# Patient Record
Sex: Female | Born: 1965
Health system: Southern US, Community
[De-identification: ages and names within clinical notes are randomized; demographics above are authoritative.]

## PROBLEM LIST (undated history)

## (undated) DIAGNOSIS — G2581 Restless legs syndrome: Secondary | ICD-10-CM

## (undated) DIAGNOSIS — F329 Major depressive disorder, single episode, unspecified: Secondary | ICD-10-CM

## (undated) DIAGNOSIS — G4730 Sleep apnea, unspecified: Secondary | ICD-10-CM

## (undated) DIAGNOSIS — F32A Depression, unspecified: Secondary | ICD-10-CM

## (undated) DIAGNOSIS — K219 Gastro-esophageal reflux disease without esophagitis: Secondary | ICD-10-CM

## (undated) DIAGNOSIS — K449 Diaphragmatic hernia without obstruction or gangrene: Secondary | ICD-10-CM

## (undated) DIAGNOSIS — E669 Obesity, unspecified: Secondary | ICD-10-CM

## (undated) DIAGNOSIS — I10 Essential (primary) hypertension: Secondary | ICD-10-CM

## (undated) DIAGNOSIS — M797 Fibromyalgia: Secondary | ICD-10-CM

## (undated) DIAGNOSIS — E559 Vitamin D deficiency, unspecified: Secondary | ICD-10-CM

## (undated) HISTORY — DX: Depression, unspecified: F32.A

## (undated) HISTORY — DX: Gastro-esophageal reflux disease without esophagitis: K21.9

## (undated) HISTORY — DX: Restless legs syndrome: G25.81

## (undated) HISTORY — DX: Essential (primary) hypertension: I10

## (undated) HISTORY — DX: Diaphragmatic hernia without obstruction or gangrene: K44.9

## (undated) HISTORY — DX: Fibromyalgia: M79.7

## (undated) HISTORY — DX: Major depressive disorder, single episode, unspecified: F32.9

## (undated) HISTORY — DX: Vitamin D deficiency, unspecified: E55.9

## (undated) HISTORY — PX: TUBAL LIGATION: SHX77

## (undated) HISTORY — DX: Obesity, unspecified: E66.9

## (undated) HISTORY — PX: CATARACT EXTRACTION: SUR2

## (undated) HISTORY — DX: Sleep apnea, unspecified: G47.30

---

## 2001-01-07 ENCOUNTER — Other Ambulatory Visit: Admission: RE | Admit: 2001-01-07 | Discharge: 2001-01-07 | Payer: Self-pay | Admitting: Obstetrics and Gynecology

## 2001-09-17 ENCOUNTER — Inpatient Hospital Stay (HOSPITAL_COMMUNITY): Admission: AD | Admit: 2001-09-17 | Discharge: 2001-09-20 | Payer: Self-pay | Admitting: Obstetrics and Gynecology

## 2003-01-10 ENCOUNTER — Encounter: Admission: RE | Admit: 2003-01-10 | Discharge: 2003-04-10 | Payer: Self-pay | Admitting: Pediatrics

## 2003-11-06 ENCOUNTER — Ambulatory Visit (HOSPITAL_COMMUNITY): Admission: RE | Admit: 2003-11-06 | Discharge: 2003-11-06 | Payer: Self-pay | Admitting: Family Medicine

## 2004-05-14 ENCOUNTER — Ambulatory Visit (HOSPITAL_BASED_OUTPATIENT_CLINIC_OR_DEPARTMENT_OTHER): Admission: RE | Admit: 2004-05-14 | Discharge: 2004-05-14 | Payer: Self-pay | Admitting: Family Medicine

## 2006-06-29 ENCOUNTER — Emergency Department (HOSPITAL_COMMUNITY): Admission: EM | Admit: 2006-06-29 | Discharge: 2006-06-29 | Payer: Self-pay | Admitting: Emergency Medicine

## 2010-12-02 ENCOUNTER — Other Ambulatory Visit (HOSPITAL_COMMUNITY): Payer: Self-pay | Admitting: Pediatrics

## 2010-12-02 DIAGNOSIS — Z139 Encounter for screening, unspecified: Secondary | ICD-10-CM

## 2010-12-09 ENCOUNTER — Ambulatory Visit (HOSPITAL_COMMUNITY)
Admission: RE | Admit: 2010-12-09 | Discharge: 2010-12-09 | Disposition: A | Discharge status: Home / Self Care | Payer: BC Managed Care – PPO | Source: Ambulatory Visit | Attending: Pediatrics | Admitting: Pediatrics

## 2010-12-09 DIAGNOSIS — Z139 Encounter for screening, unspecified: Secondary | ICD-10-CM

## 2010-12-09 DIAGNOSIS — Z1231 Encounter for screening mammogram for malignant neoplasm of breast: Secondary | ICD-10-CM | POA: Insufficient documentation

## 2010-12-10 ENCOUNTER — Ambulatory Visit (HOSPITAL_COMMUNITY): Payer: Self-pay

## 2011-05-08 ENCOUNTER — Ambulatory Visit: Payer: BC Managed Care – PPO | Admitting: Orthopedic Surgery

## 2011-05-08 ENCOUNTER — Ambulatory Visit (INDEPENDENT_AMBULATORY_CARE_PROVIDER_SITE_OTHER): Payer: BC Managed Care – PPO | Admitting: Orthopedic Surgery

## 2011-05-08 ENCOUNTER — Encounter: Payer: Self-pay | Admitting: Orthopedic Surgery

## 2011-05-08 VITALS — BP 132/82 | Ht <= 58 in | Wt 226.0 lb

## 2011-05-08 DIAGNOSIS — M7660 Achilles tendinitis, unspecified leg: Secondary | ICD-10-CM

## 2011-05-08 NOTE — Patient Instructions (Addendum)
Continue gel   Wear brace x 6 weeks   Apply ice 20 min 3 x a day

## 2011-05-12 ENCOUNTER — Encounter: Payer: Self-pay | Admitting: Orthopedic Surgery

## 2011-05-12 NOTE — Progress Notes (Signed)
Chief complaint: PAIN RIGHT ANKLE  HPI:(73) 45 year old female with moderate to severe aching, throbbing, atraumatic onset of pain RIGHT ankle.  ROS:(2) Denies chest pain, numbness or tingling.  PFSH: (1)  Past Medical History  Diagnosis Date  . Fibromyalgia   . Diabetes mellitus   . HTN (hypertension)   . RLS (restless legs syndrome)   . Depression     Physical Exam(12) GENERAL: normal development   CDV: pulses are normal   Skin: normal  Lymph: nodes were not palpable/normal  Psychiatric: awake, alert and oriented  Neuro: normal sensation  MSK AMBULATION: NO LIMP 1 TENDERNESS OVER THE ACHILLES TENDON WITH NODULE AT WATERSHED AREA  2 ROM IN THE ANKLE IS NORMAL  3 STRENGTH IN THE ANKLE IS NORMAL  4 THE ANKLE IS STABLE  5 PTT IS NON TENDER  6 LEFT ANKLE ROM AND STABILITY ARE NORMAL   Imaging: PLAIN FILMS: SEP XR REPORT; 3 VIEWS RIGHT FOOT: NORMAL FOOT STRUCTURE AND ARCHITECTURE IMPR: NORMAL FOOT   Assessment: ACHILLES TENDONOSIS AND TENDONITIS     Plan: CAM WALKER, NSAIDS, REST X 6 WEEKS

## 2011-06-17 ENCOUNTER — Telehealth: Payer: Self-pay | Admitting: Orthopedic Surgery

## 2011-06-17 NOTE — Telephone Encounter (Signed)
Patient called to relay that the top piece of the velcro and also a piece of plastic has broken off of the brace (cam walker) received at 05/08/11 office visit.  Her phone # is [phone].  * I contacted [employer] brace representative, Murrell Converse via email to advise.

## 2011-06-23 NOTE — Telephone Encounter (Signed)
06/23/11 Sent a follow up email to brace representative and advised patient of this information.

## 2011-06-24 ENCOUNTER — Encounter: Payer: Self-pay | Admitting: Orthopedic Surgery

## 2011-06-24 ENCOUNTER — Ambulatory Visit (INDEPENDENT_AMBULATORY_CARE_PROVIDER_SITE_OTHER): Payer: BC Managed Care – PPO | Admitting: Orthopedic Surgery

## 2011-06-24 VITALS — BP 122/82 | Ht <= 58 in | Wt 220.0 lb

## 2011-06-24 DIAGNOSIS — M67979 Unspecified disorder of synovium and tendon, unspecified ankle and foot: Secondary | ICD-10-CM

## 2011-06-24 DIAGNOSIS — S93499A Sprain of other ligament of unspecified ankle, initial encounter: Secondary | ICD-10-CM

## 2011-06-24 DIAGNOSIS — S96819A Strain of other specified muscles and tendons at ankle and foot level, unspecified foot, initial encounter: Secondary | ICD-10-CM

## 2011-06-24 MED ORDER — NABUMETONE 500 MG PO TABS: 500.0000 mg | ORAL_TABLET | Freq: Two times a day (BID) | ORAL | Status: AC

## 2011-06-24 NOTE — Progress Notes (Signed)
Routine follow up  Previous visit:   Assessment: ACHILLES TENDONOSIS AND TENDONITIS   Plan: CAM WALKER, NSAIDS, REST X 6 WEEKS   Subjective: There has been mild improvement overall in the condition of the RIGHT foot, although the patient has noted that the Cam Dan Humphreys has come apart. She still has complaints of swelling intermittently, and she had difficulty shopping, the other day. Exam Shows that dorsiflexion is +5, plantar flexion, about 20 tenderness is over the Achilles tendon. No swelling today. There is some pain in the lateral part of the foot just beneath the malleolus.  Recommend open back shoe. Physical therapy, change in symptoms, nabumetone come back in a month if no improvement. MRI

## 2011-06-24 NOTE — Telephone Encounter (Signed)
Patient received call from [facility], representative, 06/23/11, and states replacement brace here in office for her appointment 06/24/11.  * Patient received the brace 06/24/11.  Per Joni Reining, no charge for replacement.

## 2011-06-24 NOTE — Patient Instructions (Signed)
START PT - WEAR OPEN BACK SHOE - START NEW NSAID

## 2011-07-31 ENCOUNTER — Ambulatory Visit: Payer: BC Managed Care – PPO | Admitting: Orthopedic Surgery

## 2011-07-31 ENCOUNTER — Telehealth: Payer: Self-pay | Admitting: Orthopedic Surgery

## 2011-07-31 NOTE — Telephone Encounter (Signed)
Patient had left a voice message at 9:03pm 07/30/11 about her appointment for today, 07/31/11, to notify us that she needs to cancel, and hold on re-scheduling at this time.  States she's been unable to continue the physical therapy and will follow up on the therapy and the appointment as soon as she can.  Said she is "between a rock and a hard place right now."  Left follow up message for patient.

## 2011-08-06 NOTE — Telephone Encounter (Signed)
As of today, 08/06/11, no further reply back from patient to follow up message.

## 2012-05-17 ENCOUNTER — Other Ambulatory Visit (HOSPITAL_COMMUNITY): Payer: Self-pay | Admitting: Physician Assistant

## 2012-05-17 ENCOUNTER — Other Ambulatory Visit (HOSPITAL_BASED_OUTPATIENT_CLINIC_OR_DEPARTMENT_OTHER): Payer: Self-pay | Admitting: Physician Assistant

## 2012-05-17 DIAGNOSIS — Z0000 Encounter for general adult medical examination without abnormal findings: Secondary | ICD-10-CM

## 2012-05-24 ENCOUNTER — Ambulatory Visit (HOSPITAL_COMMUNITY)
Admission: RE | Admit: 2012-05-24 | Discharge: 2012-05-24 | Disposition: A | Discharge status: Home / Self Care | Payer: BC Managed Care – PPO | Source: Ambulatory Visit | Attending: Physician Assistant | Admitting: Physician Assistant

## 2012-05-24 DIAGNOSIS — Z1231 Encounter for screening mammogram for malignant neoplasm of breast: Secondary | ICD-10-CM | POA: Insufficient documentation

## 2012-05-24 DIAGNOSIS — Z0000 Encounter for general adult medical examination without abnormal findings: Secondary | ICD-10-CM

## 2014-01-11 ENCOUNTER — Other Ambulatory Visit (HOSPITAL_COMMUNITY): Payer: Self-pay | Admitting: Physician Assistant

## 2014-01-11 DIAGNOSIS — Z139 Encounter for screening, unspecified: Secondary | ICD-10-CM

## 2014-01-13 ENCOUNTER — Ambulatory Visit (HOSPITAL_COMMUNITY)
Admission: RE | Admit: 2014-01-13 | Discharge: 2014-01-13 | Disposition: A | Discharge status: Home / Self Care | Payer: BC Managed Care – PPO | Source: Ambulatory Visit | Attending: Physician Assistant | Admitting: Physician Assistant

## 2014-01-13 DIAGNOSIS — Z139 Encounter for screening, unspecified: Secondary | ICD-10-CM

## 2014-01-13 DIAGNOSIS — Z1231 Encounter for screening mammogram for malignant neoplasm of breast: Secondary | ICD-10-CM | POA: Insufficient documentation

## 2014-04-06 ENCOUNTER — Other Ambulatory Visit (HOSPITAL_COMMUNITY): Payer: Self-pay | Admitting: Family Medicine

## 2014-04-06 DIAGNOSIS — N949 Unspecified condition associated with female genital organs and menstrual cycle: Secondary | ICD-10-CM

## 2014-04-06 DIAGNOSIS — N852 Hypertrophy of uterus: Secondary | ICD-10-CM

## 2014-04-10 ENCOUNTER — Other Ambulatory Visit (HOSPITAL_COMMUNITY): Payer: Self-pay | Admitting: Family Medicine

## 2014-04-10 DIAGNOSIS — N949 Unspecified condition associated with female genital organs and menstrual cycle: Secondary | ICD-10-CM

## 2014-04-10 DIAGNOSIS — N852 Hypertrophy of uterus: Secondary | ICD-10-CM

## 2014-04-11 ENCOUNTER — Ambulatory Visit (HOSPITAL_COMMUNITY)
Admission: RE | Admit: 2014-04-11 | Discharge: 2014-04-11 | Disposition: A | Discharge status: Home / Self Care | Payer: BC Managed Care – PPO | Source: Ambulatory Visit | Attending: Family Medicine | Admitting: Family Medicine

## 2014-04-11 DIAGNOSIS — N949 Unspecified condition associated with female genital organs and menstrual cycle: Secondary | ICD-10-CM

## 2014-04-11 DIAGNOSIS — N852 Hypertrophy of uterus: Secondary | ICD-10-CM

## 2015-11-15 DIAGNOSIS — M25511 Pain in right shoulder: Secondary | ICD-10-CM | POA: Diagnosis not present

## 2015-11-15 DIAGNOSIS — M25512 Pain in left shoulder: Secondary | ICD-10-CM | POA: Diagnosis not present

## 2015-11-15 DIAGNOSIS — R296 Repeated falls: Secondary | ICD-10-CM | POA: Diagnosis not present

## 2015-11-15 DIAGNOSIS — M797 Fibromyalgia: Secondary | ICD-10-CM | POA: Diagnosis not present

## 2015-12-26 DIAGNOSIS — M797 Fibromyalgia: Secondary | ICD-10-CM | POA: Diagnosis not present

## 2015-12-26 DIAGNOSIS — I10 Essential (primary) hypertension: Secondary | ICD-10-CM | POA: Diagnosis not present

## 2015-12-26 DIAGNOSIS — M2550 Pain in unspecified joint: Secondary | ICD-10-CM | POA: Diagnosis not present

## 2015-12-26 DIAGNOSIS — R5382 Chronic fatigue, unspecified: Secondary | ICD-10-CM | POA: Diagnosis not present

## 2015-12-26 DIAGNOSIS — Z719 Counseling, unspecified: Secondary | ICD-10-CM | POA: Diagnosis not present

## 2015-12-26 DIAGNOSIS — E119 Type 2 diabetes mellitus without complications: Secondary | ICD-10-CM | POA: Diagnosis not present

## 2016-04-21 ENCOUNTER — Other Ambulatory Visit (HOSPITAL_COMMUNITY): Payer: Self-pay | Admitting: Family Medicine

## 2016-04-21 DIAGNOSIS — Z1231 Encounter for screening mammogram for malignant neoplasm of breast: Secondary | ICD-10-CM

## 2016-04-28 ENCOUNTER — Ambulatory Visit (HOSPITAL_COMMUNITY)
Admission: RE | Admit: 2016-04-28 | Discharge: 2016-04-28 | Disposition: A | Discharge status: Home / Self Care | Payer: BLUE CROSS/BLUE SHIELD | Source: Ambulatory Visit | Attending: Family Medicine | Admitting: Family Medicine

## 2016-04-28 DIAGNOSIS — Z1231 Encounter for screening mammogram for malignant neoplasm of breast: Secondary | ICD-10-CM | POA: Diagnosis not present

## 2017-05-19 LAB — LIPID PANEL
CHOLESTEROL: 164 (ref 0–200)
HDL: 66 (ref 35–70)
LDL Cholesterol: 76
Triglycerides: 130 (ref 40–160)

## 2017-05-19 LAB — VITAMIN D 25 HYDROXY (VIT D DEFICIENCY, FRACTURES): Vit D, 25-Hydroxy: 19.3

## 2017-05-19 LAB — HEMOGLOBIN A1C: HEMOGLOBIN A1C: 13.8

## 2017-06-29 ENCOUNTER — Ambulatory Visit: Payer: BLUE CROSS/BLUE SHIELD | Admitting: "Endocrinology

## 2017-06-29 ENCOUNTER — Encounter: Payer: Self-pay | Admitting: "Endocrinology

## 2017-06-29 VITALS — BP 157/99 | HR 90 | Ht <= 58 in | Wt 234.0 lb

## 2017-06-29 DIAGNOSIS — E1165 Type 2 diabetes mellitus with hyperglycemia: Secondary | ICD-10-CM | POA: Diagnosis not present

## 2017-06-29 DIAGNOSIS — I10 Essential (primary) hypertension: Secondary | ICD-10-CM | POA: Diagnosis not present

## 2017-06-29 MED ORDER — VITAMIN D3 125 MCG (5000 UT) PO CAPS: 5000.0000 [IU] | ORAL_CAPSULE | Freq: Every day | ORAL | 0 refills | Status: DC

## 2017-06-29 NOTE — Patient Instructions (Signed)

## 2017-06-29 NOTE — Progress Notes (Signed)
Consult Note       06/29/2017, 4:41 PM   Subjective:    Patient ID: Nicole [name], female    DOB: Apr 20, 1966.  Nicole [name] is being seen in consultation for management of currently uncontrolled symptomatic diabetes requested by  Jonathon BellowsMcGee, Rachel, DO.   Past Medical History:  Diagnosis Date  . Depression   . Diabetes mellitus   . Fibromyalgia   . HTN (hypertension)   . RLS (restless legs syndrome)    Past Surgical History:  Procedure Laterality Date  . CESAREAN SECTION    . TUBAL LIGATION     Social History   Socioeconomic History  . Marital status: Married    Spouse name: None  . Number of children: None  . Years of education: MBA  . Highest education level: None  Social Needs  . Financial resource strain: None  . Food insecurity - worry: None  . Food insecurity - inability: None  . Transportation needs - medical: None  . Transportation needs - non-medical: None  Occupational History  . Occupation: stay at home mom  . Occupation: substitute  Tobacco Use  . Smoking status: Never Smoker  . Smokeless tobacco: Never Used  Substance and Sexual Activity  . Alcohol use: No  . Drug use: No  . Sexual activity: None  Other Topics Concern  . None  Social History Narrative  . None   Outpatient Encounter Medications as of 06/29/2017  Medication Sig  . aspirin EC 81 MG tablet Take 81 mg by mouth daily.  . Insulin Glargine (TOUJEO SOLOSTAR) 300 UNIT/ML SOPN Inject 40 Units into the skin at bedtime.  . liraglutide (VICTOZA) 18 MG/3ML SOPN Inject 1.2 mg into the skin daily.  . pregabalin (LYRICA) 75 MG capsule Take 75 mg by mouth 2 (two) times daily.  . [DISCONTINUED] pioglitazone (ACTOS) 45 MG tablet Take 45 mg by mouth daily.  . Cholecalciferol (VITAMIN D3) 5000 units CAPS Take 1 capsule (5,000 Units total) by mouth daily.  . [DISCONTINUED] diclofenac sodium (VOLTAREN) 1 % GEL Apply  topically.    . [DISCONTINUED] gabapentin (NEURONTIN) 100 MG capsule Take 300 mg by mouth 3 (three) times daily.    . [DISCONTINUED] insulin glargine (LANTUS) 100 UNIT/ML injection Per sliding scale   . [DISCONTINUED] levofloxacin (LEVAQUIN) 500 MG tablet TAKE 1 TABLET BY MOUTH EVERY EVERY 24 HOURS FOR 10 DAYS  . [DISCONTINUED] lisinopril (PRINIVIL,ZESTRIL) 5 MG tablet Take 5 mg by mouth daily.    . [DISCONTINUED] metFORMIN (GLUCOPHAGE) 500 MG tablet Take 500 mg by mouth 2 (two) times daily with a meal.    . [DISCONTINUED] pioglitazone (ACTOS) 45 MG tablet daily.  . [DISCONTINUED] VICTOZA 18 MG/3ML SOPN 0.6 mg daily.   No facility-administered encounter medications on file as of 06/29/2017.     ALLERGIES: Allergies  Allergen Reactions  . Metformin And Related     VACCINATION STATUS:  There is no immunization history on file for this patient.  Diabetes  She presents for her initial diabetic visit. She has type 2 diabetes mellitus. Onset time: She was diagnosed at approximate age of 32 years. She had history  of gestational diabetes with her second pregnancy 1 year prior to her diagnosed with type 2 diabetes. Her disease course has been worsening. There are no hypoglycemic associated symptoms. Pertinent negatives for hypoglycemia include no confusion, headaches, pallor or seizures. Associated symptoms include blurred vision, fatigue, polydipsia and polyuria. Pertinent negatives for diabetes include no chest pain and no polyphagia. There are no hypoglycemic complications. Symptoms are worsening. There are no diabetic complications. Risk factors for coronary artery disease include diabetes mellitus, dyslipidemia, family history, hypertension, obesity and sedentary lifestyle. Current diabetic treatment includes insulin injections and oral agent (dual therapy) (Currently on basal insulin 27 units every morning, Actos to 45 minutes by mouth daily, Victoza 0.6 mg daily.). Her weight is increasing  steadily. She is following a generally unhealthy diet. When asked about meal planning, she reported none. She has had a previous visit with a dietitian. She never participates in exercise. (She did not bring any meter nor logs to review today. Her most recent A1c was 13.8% from 05/19/2017.) An ACE inhibitor/angiotensin II receptor blocker is being taken. Eye exam is current.  Hypertension  This is a chronic problem. The current episode started more than 1 year ago. The problem is uncontrolled. Associated symptoms include blurred vision. Pertinent negatives include no chest pain, headaches, palpitations or shortness of breath. Risk factors for coronary artery disease include diabetes mellitus, dyslipidemia, obesity and sedentary lifestyle. Past treatments include ACE inhibitors.      Review of Systems  Constitutional: Positive for fatigue. Negative for chills, fever and unexpected weight change.  HENT: Negative for trouble swallowing and voice change.   Eyes: Positive for blurred vision. Negative for visual disturbance.  Respiratory: Negative for cough, shortness of breath and wheezing.   Cardiovascular: Negative for chest pain, palpitations and leg swelling.  Gastrointestinal: Negative for diarrhea, nausea and vomiting.  Endocrine: Positive for polydipsia and polyuria. Negative for cold intolerance, heat intolerance and polyphagia.  Musculoskeletal: Negative for arthralgias and myalgias.  Skin: Negative for color change, pallor, rash and wound.  Neurological: Negative for seizures and headaches.  Psychiatric/Behavioral: Negative for confusion and suicidal ideas.    Objective:    BP (!) 157/99   Pulse 90   Ht 4\' 9"  (1.448 m)   Wt 234 lb (106.1 kg)   BMI 50.64 kg/m   Wt Readings from Last 3 Encounters:  06/29/17 234 lb (106.1 kg)  06/24/11 220 lb (99.8 kg)  05/08/11 226 lb (102.5 kg)     Physical Exam  Constitutional: She is oriented to person, place, and time. She appears  well-developed.  HENT:  Head: Normocephalic and atraumatic.  Eyes: EOM are normal.  Neck: Normal range of motion. Neck supple. No tracheal deviation present. No thyromegaly present.  Cardiovascular: Normal rate and regular rhythm.  Pulmonary/Chest: Effort normal and breath sounds normal.  Abdominal: Soft. Bowel sounds are normal. There is no tenderness. There is no guarding.  Musculoskeletal: Normal range of motion. She exhibits no edema.  Neurological: She is alert and oriented to person, place, and time. She has normal reflexes. No cranial nerve deficit. Coordination normal.  Skin: Skin is warm and dry. No rash noted. No erythema. No pallor.  Psychiatric: She has a normal mood and affect. Judgment normal.     Diabetic Labs (most recent): Lab Results  Component Value Date   HGBA1C 13.8 05/19/2017     Lipid Panel ( most recent) Lipid Panel     Component Value Date/Time   CHOL 164 05/19/2017   TRIG 130  05/19/2017   HDL 66 05/19/2017   LDLCALC 76 05/19/2017      Assessment & Plan:   1. Uncontrolled type 2 diabetes mellitus with hyperglycemia (HCC)  - Nicole [name] has currently uncontrolled symptomatic type 2 DM since  51 years of age,  with most recent A1c of 13.8 %. Recent labs reviewed.  -her diabetes is complicated by obesity/sedentary life and Nicole [name] remains at a high risk for more acute and chronic complications which include CAD, CVA, CKD, retinopathy, and neuropathy. These are all discussed in detail with the patient.  - I have counseled her on diet management and weight loss, by adopting a carbohydrate restricted/protein rich diet.  - Suggestion is made for her to avoid simple carbohydrates  from her diet including Cakes, Sweet Desserts, Ice Cream, Soda (diet and regular), Sweet Tea, Candies, Chips, Cookies, Store Bought Juices, Alcohol in Excess of  1-2 drinks a day, Artificial Sweeteners, and "Sugar-free" Products. This will help patient to have  stable blood glucose profile and potentially avoid unintended weight gain.  - I encouraged her to switch to  unprocessed or minimally processed complex starch and increased protein intake (animal or plant source), fruits, and vegetables.  - she is advised to stick to a routine mealtimes to eat 3 meals  a day and avoid unnecessary snacks ( to snack only to correct hypoglycemia).   - she will be scheduled with Norm Salt, RDN, CDE for individualized diabetes education.  - I have approached her with the following individualized plan to manage diabetes and patient agrees:   - Given her current glycemic pertinent, she may very swelling require basal/bolus insulin treatment to achieve control of diabetes to target. - However, it is essential to assure her commitment for proper monitoring of blood glucose for safe use of insulin. - I approached her to initiate strict monitoring of blood glucose 4 times a day-before meals and at bedtime and return in one week with her meter and logs for reevaluation. - In the meantime, I have adjusted her basal insulin Toujeo to 40 units at bedtime, increase Victoza to 1.2 mg daily-half hour before her biggest meal, discontinue Actos. - she reports intolerance to metformin. - Patient is warned not to take insulin without proper monitoring per orders. -Patient is encouraged to call clinic for blood glucose levels less than 70 or above 300 mg /dl.  - Patient specific target  A1c;  LDL, HDL, Triglycerides, and  Waist Circumference were discussed in detail.  2) BP/HTN: Uncontrolled. Continue current medications including ACEI/ARB. 3) Lipids/HPL:    Controlled LDL at 76.   Patient is not on statins. 4)  Weight/Diet: CDE Consult will be initiated , exercise, and detailed carbohydrates information provided. She'll be a good candidate for bariatric surgery, briefly discussed with her. More discussion on subsequent visits.  5) Chronic Care/Health Maintenance:  -she  is  on ACEI/ARB  is encouraged to continue to follow up with Ophthalmology, Dentist,  Podiatrist at least yearly or according to recommendations, and advised to   stay away from smoking. I have recommended yearly flu vaccine and pneumonia vaccination at least every 5 years; moderate intensity exercise for up to 150 minutes weekly; and  sleep for at least 7 hours a day.  - I advised patient to maintain close follow up with Jonathon Bellows, DO for primary care needs.  - Time spent with the patient: 1 hour, of which >50% was spent in obtaining information about her symptoms, reviewing her  previous labs, evaluations, and treatments, counseling her about her  uncontrolled type 2 diabetes, hypertension, and developing a plan for long term treatment; her  questions were answered to her satisfaction.  Follow up plan: - Return in about 1 week (around 07/06/2017) for follow up with meter and logs- no labs.  Marquis Lunch, MD [facility] [address] [place], [place] [address] Phone: [phone]  Fax: [phone]    06/29/2017, 4:41 PM  This note was partially dictated with voice recognition software. Similar sounding words can be transcribed inadequately or may not  be corrected upon review.

## 2017-07-02 ENCOUNTER — Other Ambulatory Visit: Payer: Self-pay

## 2017-07-02 ENCOUNTER — Telehealth: Payer: Self-pay | Admitting: "Endocrinology

## 2017-07-02 MED ORDER — LIRAGLUTIDE 18 MG/3ML ~~LOC~~ SOPN: 1.2000 mg | PEN_INJECTOR | Freq: Every day | SUBCUTANEOUS | 2 refills | Status: DC

## 2017-07-02 MED ORDER — INSULIN GLARGINE 300 UNIT/ML ~~LOC~~ SOPN
40.0000 [IU] | PEN_INJECTOR | Freq: Every day | SUBCUTANEOUS | 2 refills | Status: DC
Start: 2017-07-02 — End: 2017-07-09

## 2017-07-02 NOTE — Telephone Encounter (Signed)
Nicole LagerYolanda needs her new Rx of Toujeo and Victoza called to [facility], please advise?

## 2017-07-02 NOTE — Telephone Encounter (Signed)
Rx sent 

## 2017-07-09 ENCOUNTER — Encounter: Payer: Self-pay | Admitting: "Endocrinology

## 2017-07-09 ENCOUNTER — Ambulatory Visit: Payer: BLUE CROSS/BLUE SHIELD | Admitting: "Endocrinology

## 2017-07-09 VITALS — BP 144/88 | HR 91 | Ht <= 58 in | Wt 236.0 lb

## 2017-07-09 DIAGNOSIS — E1165 Type 2 diabetes mellitus with hyperglycemia: Secondary | ICD-10-CM

## 2017-07-09 DIAGNOSIS — I10 Essential (primary) hypertension: Secondary | ICD-10-CM | POA: Diagnosis not present

## 2017-07-09 MED ORDER — ACCU-CHEK GUIDE W/DEVICE KIT: 1.0000 | PACK | 0 refills | Status: AC

## 2017-07-09 MED ORDER — GLUCOSE BLOOD VI STRP
ORAL_STRIP | 2 refills | Status: DC
Start: 2017-07-09 — End: 2020-04-09

## 2017-07-09 MED ORDER — INSULIN GLARGINE 300 UNIT/ML ~~LOC~~ SOPN: 40.0000 [IU] | PEN_INJECTOR | Freq: Every day | SUBCUTANEOUS | 2 refills | Status: DC

## 2017-07-09 NOTE — Patient Instructions (Signed)

## 2017-07-09 NOTE — Progress Notes (Signed)
Consult Note       07/09/2017, 4:07 PM   Subjective:    Patient ID: Nicole [name], female    DOB: 08/31/1965.  Nicole [name] is being seen in consultation for management of currently uncontrolled symptomatic diabetes requested by  Sherrilee Gilles, DO.   Past Medical History:  Diagnosis Date  . Depression   . Diabetes mellitus   . Fibromyalgia   . HTN (hypertension)   . RLS (restless legs syndrome)    Past Surgical History:  Procedure Laterality Date  . CESAREAN SECTION    . TUBAL LIGATION     Social History   Socioeconomic History  . Marital status: Married    Spouse name: None  . Number of children: None  . Years of education: MBA  . Highest education level: None  Social Needs  . Financial resource strain: None  . Food insecurity - worry: None  . Food insecurity - inability: None  . Transportation needs - medical: None  . Transportation needs - non-medical: None  Occupational History  . Occupation: stay at home mom  . Occupation: substitute  Tobacco Use  . Smoking status: Never Smoker  . Smokeless tobacco: Never Used  Substance and Sexual Activity  . Alcohol use: No  . Drug use: No  . Sexual activity: None  Other Topics Concern  . None  Social History Narrative  . None   Outpatient Encounter Medications as of 07/09/2017  Medication Sig  . aspirin EC 81 MG tablet Take 81 mg by mouth daily.  . Blood Glucose Monitoring Suppl (ACCU-CHEK GUIDE) w/Device KIT 1 Piece by Does not apply route as directed.  . Cholecalciferol (VITAMIN D3) 5000 units CAPS Take 1 capsule (5,000 Units total) by mouth daily.  Marland Kitchen glucose blood (ACCU-CHEK GUIDE) test strip Use as instructed  . Insulin Glargine (TOUJEO SOLOSTAR) 300 UNIT/ML SOPN Inject 40 Units into the skin at bedtime.  . liraglutide (VICTOZA) 18 MG/3ML SOPN Inject 0.2 mLs (1.2 mg total) into the skin daily.  . pregabalin (LYRICA) 75 MG  capsule Take 75 mg by mouth 2 (two) times daily.  . [DISCONTINUED] Insulin Glargine (TOUJEO SOLOSTAR) 300 UNIT/ML SOPN Inject 40 Units into the skin at bedtime.   No facility-administered encounter medications on file as of 07/09/2017.     ALLERGIES: Allergies  Allergen Reactions  . Metformin And Related     VACCINATION STATUS:  There is no immunization history on file for this patient.  Diabetes  She presents for her follow-up diabetic visit. She has type 2 diabetes mellitus. Onset time: She was diagnosed at approximate age of 36 years. She had history of gestational diabetes with her second pregnancy 1 year prior to her diagnosed with type 2 diabetes. Her disease course has been improving. There are no hypoglycemic associated symptoms. Pertinent negatives for hypoglycemia include no confusion, headaches, pallor or seizures. Associated symptoms include fatigue. Pertinent negatives for diabetes include no blurred vision, no chest pain, no polydipsia, no polyphagia and no polyuria. There are no hypoglycemic complications. Symptoms are improving. There are no diabetic complications. Risk factors for coronary artery disease include diabetes mellitus, dyslipidemia,  family history, hypertension, obesity and sedentary lifestyle. Current diabetic treatment includes insulin injections and oral agent (dual therapy) (Currently on basal insulin 27 units every morning, Actos to 45 minutes by mouth daily, Victoza 0.6 mg daily.). Her weight is stable. She is following a generally unhealthy diet. When asked about meal planning, she reported none. She has had a previous visit with a dietitian. She never participates in exercise. Her breakfast blood glucose range is generally 110-130 mg/dl. Her lunch blood glucose range is generally 110-130 mg/dl. Her dinner blood glucose range is generally 110-130 mg/dl. Her bedtime blood glucose range is generally 110-130 mg/dl. Her overall blood glucose range is 110-130 mg/dl.  (She came with significant blood glucose profile improvement, averaging between 125 and 138.  Her most recent A1c was 13.8% from 05/19/2017.) An ACE inhibitor/angiotensin II receptor blocker is being taken. Eye exam is current.  Hypertension  This is a chronic problem. The current episode started more than 1 year ago. The problem is uncontrolled. Pertinent negatives include no blurred vision, chest pain, headaches, palpitations or shortness of breath. Risk factors for coronary artery disease include diabetes mellitus, dyslipidemia, obesity and sedentary lifestyle. Past treatments include ACE inhibitors.    Review of Systems  Constitutional: Positive for fatigue. Negative for chills, fever and unexpected weight change.  HENT: Negative for trouble swallowing and voice change.   Eyes: Negative for blurred vision and visual disturbance.  Respiratory: Negative for cough, shortness of breath and wheezing.   Cardiovascular: Negative for chest pain, palpitations and leg swelling.  Gastrointestinal: Negative for diarrhea, nausea and vomiting.  Endocrine: Negative for cold intolerance, heat intolerance, polydipsia, polyphagia and polyuria.  Musculoskeletal: Negative for arthralgias and myalgias.  Skin: Negative for color change, pallor, rash and wound.  Neurological: Negative for seizures and headaches.  Psychiatric/Behavioral: Negative for confusion and suicidal ideas.    Objective:    BP (!) 144/88   Pulse 91   Ht '4\' 9"'$  (1.448 m)   Wt 236 lb (107 kg)   BMI 51.07 kg/m   Wt Readings from Last 3 Encounters:  07/09/17 236 lb (107 kg)  06/29/17 234 lb (106.1 kg)  06/24/11 220 lb (99.8 kg)     Physical Exam  Constitutional: She is oriented to person, place, and time. She appears well-developed.  HENT:  Head: Normocephalic and atraumatic.  Eyes: EOM are normal.  Neck: Normal range of motion. Neck supple. No tracheal deviation present. No thyromegaly present.  Cardiovascular: Normal rate and  regular rhythm.  Pulmonary/Chest: Effort normal and breath sounds normal.  Abdominal: Soft. Bowel sounds are normal. There is no tenderness. There is no guarding.  Musculoskeletal: Normal range of motion. She exhibits no edema.  Neurological: She is alert and oriented to person, place, and time. She has normal reflexes. No cranial nerve deficit. Coordination normal.  Skin: Skin is warm and dry. No rash noted. No erythema. No pallor.  Psychiatric: She has a normal mood and affect. Judgment normal.    Diabetic Labs (most recent): Lab Results  Component Value Date   HGBA1C 13.8 05/19/2017     Lipid Panel ( most recent) Lipid Panel     Component Value Date/Time   CHOL 164 05/19/2017   TRIG 130 05/19/2017   HDL 66 05/19/2017   LDLCALC 76 05/19/2017     Assessment & Plan:   1. Uncontrolled type 2 diabetes mellitus with hyperglycemia ([place])  - Nicole [name] has currently uncontrolled symptomatic type 2 DM since  51 years of age,  with most recent A1c of 13.8 %. Recent labs reviewed. - She came with traumatic improvement in her blood glucose profile, no hypoglycemia.  -her diabetes is complicated by obesity/sedentary life and Nicole [name] remains at a high risk for more acute and chronic complications which include CAD, CVA, CKD, retinopathy, and neuropathy. These are all discussed in detail with the patient.  - I have counseled her on diet management and weight loss, by adopting a carbohydrate restricted/protein rich diet.  -  Suggestion is made for her to avoid simple carbohydrates  from her diet including Cakes, Sweet Desserts / Pastries, Ice Cream, Soda (diet and regular), Sweet Tea, Candies, Chips, Cookies, Store Bought Juices, Alcohol in Excess of  1-2 drinks a day, Artificial Sweeteners, and "Sugar-free" Products. This will help patient to have stable blood glucose profile and potentially avoid unintended weight gain.   - I encouraged her to switch to  unprocessed or  minimally processed complex starch and increased protein intake (animal or plant source), fruits, and vegetables.  - she is advised to stick to a routine mealtimes to eat 3 meals  a day and avoid unnecessary snacks ( to snack only to correct hypoglycemia).   - she will be scheduled with Jearld Fenton, RDN, CDE for individualized diabetes education- consult pending.  - I have approached her with the following individualized plan to manage diabetes and patient agrees:   - Given her presentation with near target glycemic, she will not require bolus insulin for now.  -  I have advised her to continue Toujeo  40 units at bedtime, associated with monitoring of blood glucose 2 times a day- daily before breakfast and at bedtime.   - she reports intolerance to metformin. - Patient is warned not to take insulin without proper monitoring per orders. -Patient is encouraged to call clinic for blood glucose levels less than 70 or above 300 mg /dl.  - Patient specific target  A1c;  LDL, HDL, Triglycerides, and  Waist Circumference were discussed in detail.  2) BP/HTN: Uncontrolled. Continue current medications including ACEI/ARB. 3) Lipids/HPL:    Controlled LDL at 76.   Patient is not on statins. 4)  Weight/Diet: CDE Consult has been  initiated , exercise, and detailed carbohydrates information provided. She'll be a good candidate for bariatric surgery, briefly discussed with her. More discussion on subsequent visits.  5) Chronic Care/Health Maintenance:  -she  is on ACEI/ARB  is encouraged to continue to follow up with Ophthalmology, Dentist,  Podiatrist at least yearly or according to recommendations, and advised to   stay away from smoking. I have recommended yearly flu vaccine and pneumonia vaccination at least every 5 years; moderate intensity exercise for up to 150 minutes weekly; and  sleep for at least 7 hours a day.  - I advised patient to maintain close follow up with Sherrilee Gilles, DO for  primary care needs. - Time spent with the patient: 25 min, of which >50% was spent in reviewing her sugar logs , discussing her hypo- and hyper-glycemic episodes, reviewing her current and  previous labs and insulin doses and developing a plan to avoid hypo- and hyper-glycemia.    Follow up plan: - Return in about 3 months (around 10/07/2017) for meter, and logs.  Glade Lloyd, MD [facility] [address] [place], [place] [address] Phone: [phone]  Fax: [phone]    07/09/2017, 4:07 PM  This note was partially dictated with voice recognition software. Similar sounding  words can be transcribed inadequately or may not  be corrected upon review.

## 2017-08-27 ENCOUNTER — Other Ambulatory Visit: Payer: Self-pay

## 2017-08-27 MED ORDER — INSULIN GLARGINE 300 UNIT/ML ~~LOC~~ SOPN: 40.0000 [IU] | PEN_INJECTOR | Freq: Every day | SUBCUTANEOUS | 0 refills | Status: DC

## 2017-08-27 MED ORDER — LIRAGLUTIDE 18 MG/3ML ~~LOC~~ SOPN: 1.2000 mg | PEN_INJECTOR | Freq: Every day | SUBCUTANEOUS | 0 refills | Status: DC

## 2017-10-13 ENCOUNTER — Telehealth: Payer: Self-pay

## 2017-10-13 NOTE — Telephone Encounter (Signed)
Pt states that she will not be returning to the office. She called Jan 2019 for a 90 day supply to be sent to pharmacy. This was sent on 08-27-17. Pt never expressed any concerns about not receiving this prescription. She has not called since then til today and states that she will not be returning due to not being able to get her Toujeo which we had sent did send on 08-27-17.

## 2017-10-14 ENCOUNTER — Ambulatory Visit: Payer: BLUE CROSS/BLUE SHIELD | Admitting: "Endocrinology

## 2017-12-29 ENCOUNTER — Other Ambulatory Visit (HOSPITAL_COMMUNITY): Payer: Self-pay | Admitting: Family Medicine

## 2017-12-29 DIAGNOSIS — Z1231 Encounter for screening mammogram for malignant neoplasm of breast: Secondary | ICD-10-CM

## 2018-01-01 ENCOUNTER — Encounter (HOSPITAL_COMMUNITY): Payer: Self-pay | Admitting: Radiology

## 2018-01-01 ENCOUNTER — Ambulatory Visit (HOSPITAL_COMMUNITY)
Admission: RE | Admit: 2018-01-01 | Discharge: 2018-01-01 | Disposition: A | Discharge status: Home / Self Care | Payer: BLUE CROSS/BLUE SHIELD | Source: Ambulatory Visit | Attending: Family Medicine | Admitting: Family Medicine

## 2018-01-01 DIAGNOSIS — Z1231 Encounter for screening mammogram for malignant neoplasm of breast: Secondary | ICD-10-CM | POA: Insufficient documentation

## 2018-07-12 DIAGNOSIS — M7661 Achilles tendinitis, right leg: Secondary | ICD-10-CM | POA: Diagnosis not present

## 2018-07-12 DIAGNOSIS — M2142 Flat foot [pes planus] (acquired), left foot: Secondary | ICD-10-CM | POA: Diagnosis not present

## 2018-07-12 DIAGNOSIS — M25571 Pain in right ankle and joints of right foot: Secondary | ICD-10-CM | POA: Diagnosis not present

## 2018-07-12 DIAGNOSIS — M7662 Achilles tendinitis, left leg: Secondary | ICD-10-CM | POA: Diagnosis not present

## 2018-07-12 DIAGNOSIS — M67911 Unspecified disorder of synovium and tendon, right shoulder: Secondary | ICD-10-CM | POA: Diagnosis not present

## 2018-07-12 DIAGNOSIS — M797 Fibromyalgia: Secondary | ICD-10-CM | POA: Diagnosis not present

## 2018-07-12 DIAGNOSIS — R0683 Snoring: Secondary | ICD-10-CM | POA: Diagnosis not present

## 2018-07-12 DIAGNOSIS — Z79899 Other long term (current) drug therapy: Secondary | ICD-10-CM | POA: Diagnosis not present

## 2018-07-12 DIAGNOSIS — M2141 Flat foot [pes planus] (acquired), right foot: Secondary | ICD-10-CM | POA: Diagnosis not present

## 2018-07-12 DIAGNOSIS — M25511 Pain in right shoulder: Secondary | ICD-10-CM | POA: Diagnosis not present

## 2018-07-12 DIAGNOSIS — M2550 Pain in unspecified joint: Secondary | ICD-10-CM | POA: Diagnosis not present

## 2018-07-12 DIAGNOSIS — F418 Other specified anxiety disorders: Secondary | ICD-10-CM | POA: Diagnosis not present

## 2018-07-12 DIAGNOSIS — E119 Type 2 diabetes mellitus without complications: Secondary | ICD-10-CM | POA: Diagnosis not present

## 2019-02-15 ENCOUNTER — Other Ambulatory Visit (HOSPITAL_COMMUNITY): Payer: Self-pay | Admitting: Family Medicine

## 2019-02-15 DIAGNOSIS — Z1231 Encounter for screening mammogram for malignant neoplasm of breast: Secondary | ICD-10-CM

## 2019-07-01 ENCOUNTER — Ambulatory Visit: Payer: BLUE CROSS/BLUE SHIELD | Admitting: Internal Medicine

## 2019-07-06 DIAGNOSIS — H25811 Combined forms of age-related cataract, right eye: Secondary | ICD-10-CM | POA: Diagnosis not present

## 2019-07-06 DIAGNOSIS — Z01818 Encounter for other preprocedural examination: Secondary | ICD-10-CM | POA: Diagnosis not present

## 2019-07-18 DIAGNOSIS — H2511 Age-related nuclear cataract, right eye: Secondary | ICD-10-CM | POA: Diagnosis not present

## 2019-07-18 DIAGNOSIS — H25811 Combined forms of age-related cataract, right eye: Secondary | ICD-10-CM | POA: Diagnosis not present

## 2019-07-27 DIAGNOSIS — H2512 Age-related nuclear cataract, left eye: Secondary | ICD-10-CM | POA: Diagnosis not present

## 2019-07-27 DIAGNOSIS — H25812 Combined forms of age-related cataract, left eye: Secondary | ICD-10-CM | POA: Diagnosis not present

## 2019-08-16 ENCOUNTER — Ambulatory Visit: Payer: BLUE CROSS/BLUE SHIELD | Admitting: Internal Medicine

## 2019-09-08 ENCOUNTER — Other Ambulatory Visit: Payer: Self-pay

## 2019-09-08 ENCOUNTER — Ambulatory Visit (HOSPITAL_COMMUNITY)
Admission: RE | Admit: 2019-09-08 | Discharge: 2019-09-08 | Disposition: A | Discharge status: Home / Self Care | Payer: BC Managed Care – PPO | Source: Ambulatory Visit | Attending: Family Medicine | Admitting: Family Medicine

## 2019-09-08 DIAGNOSIS — Z1231 Encounter for screening mammogram for malignant neoplasm of breast: Secondary | ICD-10-CM | POA: Insufficient documentation

## 2019-10-13 ENCOUNTER — Encounter: Payer: Self-pay | Admitting: Internal Medicine

## 2019-10-13 ENCOUNTER — Other Ambulatory Visit: Payer: Self-pay

## 2019-10-13 ENCOUNTER — Ambulatory Visit: Payer: BC Managed Care – PPO | Admitting: Internal Medicine

## 2019-10-13 VITALS — BP 150/70 | HR 99 | Temp 97.7°F | Ht <= 58 in | Wt 244.0 lb

## 2019-10-13 DIAGNOSIS — K219 Gastro-esophageal reflux disease without esophagitis: Secondary | ICD-10-CM

## 2019-10-13 DIAGNOSIS — R1310 Dysphagia, unspecified: Secondary | ICD-10-CM | POA: Diagnosis not present

## 2019-10-13 DIAGNOSIS — E669 Obesity, unspecified: Secondary | ICD-10-CM | POA: Diagnosis not present

## 2019-10-13 DIAGNOSIS — R1012 Left upper quadrant pain: Secondary | ICD-10-CM

## 2019-10-13 DIAGNOSIS — E1165 Type 2 diabetes mellitus with hyperglycemia: Secondary | ICD-10-CM

## 2019-10-13 DIAGNOSIS — K5900 Constipation, unspecified: Secondary | ICD-10-CM | POA: Diagnosis not present

## 2019-10-13 MED ORDER — PANTOPRAZOLE SODIUM 40 MG PO TBEC: 40.0000 mg | DELAYED_RELEASE_TABLET | Freq: Every day | ORAL | 3 refills | Status: DC

## 2019-10-13 MED ORDER — PANTOPRAZOLE SODIUM 40 MG PO TBEC: 40.0000 mg | DELAYED_RELEASE_TABLET | Freq: Two times a day (BID) | ORAL | 3 refills | Status: DC

## 2019-10-13 NOTE — Patient Instructions (Signed)
We have sent the following medications to your pharmacy for you to pick up at your convenience:  Pantoprazole  You have been scheduled for a Barium Esophogram at [facility] Radiology(1st floor of the hospital) on 10/14/2019 at 1:30pm. Please arrive 15 minutes prior to your appointment for registration. Make certain not to have anything to eat or drink 3 hours prior to your test. If you need to reschedule for any reason, please contact radiology at [phone] to do so. __________________________________________________________________ A barium swallow is an examination that concentrates on views of the esophagus. This tends to be a double contrast exam (barium and two liquids which, when combined, create a gas to distend the wall of the oesophagus) or single contrast (non-ionic iodine based). The study is usually tailored to your symptoms so a good history is essential. Attention is paid during the study to the form, structure and configuration of the esophagus, looking for functional disorders (such as aspiration, dysphagia, achalasia, motility and reflux) EXAMINATION You may be asked to change into a gown, depending on the type of swallow being performed. A radiologist and radiographer will perform the procedure. The radiologist will advise you of the type of contrast selected for your procedure and direct you during the exam. You will be asked to stand, sit or lie in several different positions and to hold a small amount of fluid in your mouth before being asked to swallow while the imaging is performed .In some instances you may be asked to swallow barium coated marshmallows to assess the motility of a solid food bolus. The exam can be recorded as a digital or video fluoroscopy procedure. POST PROCEDURE It will take 1-2 days for the barium to pass through your system. To facilitate this, it is important, unless otherwise directed, to increase your fluids for the next 24-48hrs and to resume your normal  diet.  This test typically takes about 30 minutes to perform. __________________________________________________________________________________  Use Miralax as needed  Continue to work on diabetes and weight loss

## 2019-10-13 NOTE — Progress Notes (Signed)
HISTORY OF PRESENT ILLNESS:  Nicole [name] is a 54 y.o. 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  Patient has seen a local gastroenterologist in [place] and apparently underwent colonoscopy and upper endoscopy less than 1 year ago.  Results uncertain.  She wishes a second opinion regarding the following.  Patient tells me that she was diagnosed with "ulcers" when she was a teenager.  She also reports longstanding chronic GERD which requires medical therapy for control of symptoms.  She is also had lifelong problems with constipation and history of fecal impactions intermittently (particularly at the time of her pregnancies).  She tells me that despite Nexium 20 mg twice daily she has breakthrough reflux symptoms for which she takes antacids.  She also reports intermittent solid food dysphagia to items such as rice and apples.  This has been present over the past 6 weeks.  Next, she had been using stool softeners for constipation.  This did not help.  She moves her bowels about once every 3 days.  About 2 weeks ago her PCP started her on MiraLAX.  She is taking 1 dose daily.  Now having once daily bowel movements but feels that she could evacuate a bit more to relieve abdominal fullness.  She has gained 45 pounds over the past year or so.  Her diabetes, of 17 years, is poorly controlled with her last A1c 12.5 in December 2020.  She is now seeing a new endocrinologist.  She has not had her Covid vaccinations.  Finally she mentions intermittent problems with left upper quadrant pain or fullness discomfort which is noticeable when she bends forward.  Relieved when sitting or leaning back.  She reports having undergone imaging to evaluate her abdominal discomfort.  Records are being requested.  I have reviewed outside note from her PCP.  Recent blood work from December 2019 which shows hemoglobin 11.4.   Normal comprehensive metabolic panel including liver tests.  Blood work from November 2020 shows hemoglobin 12.6.  Current BMI 54.7 she does report that her ankle swell toward the end of the day.  Makes walking difficult.  She is on diuretic.  Her PCP is aware.  REVIEW OF SYSTEMS:  All non-GI ROS negative unless otherwise stated in the HPI except for sinus allergy, anxiety, arthritis, back pain, cough, depression, fatigue, muscle cramps, night sweats, sore throat, ankle swelling, increased urination  Past Medical History:  Diagnosis Date  . Depression   . Diabetes mellitus   . Fibromyalgia   . GERD (gastroesophageal reflux disease)   . HTN (hypertension)   . Obesity   . RLS (restless legs syndrome)   . Vitamin D deficiency   . Vitamin D deficiency     Past Surgical History:  Procedure Laterality Date  . CESAREAN SECTION    . TUBAL LIGATION      Social History RUSSELL ENGELSTAD  reports that she has never smoked. She has never used smokeless tobacco. She reports that she does not drink alcohol or use drugs.  family history includes Asthma in an other family member; Cancer in an other family member; Diabetes in an other family member; Heart disease in an other family member.  Allergies  Allergen Reactions  . Metformin And Related        PHYSICAL EXAMINATION: Vital signs: BP (!) 150/70   Pulse 99   Temp 97.7 F (36.5 C)   Ht '4\' 8"'  (  1.422 m)   Wt 244 lb (110.7 kg)   LMP 04/07/2016   BMI 54.70 kg/m   Constitutional: Pleasant, morbidly obese, unhealthy appearing, no acute distress Psychiatric: alert and oriented x3, cooperative Eyes: extraocular movements intact, anicteric, conjunctiva pink Mouth: oral pharynx moist, no lesions Neck: supple no lymphadenopathy Cardiovascular: heart regular rate and rhythm, no murmur Lungs: clear to auscultation bilaterally Abdomen: soft, obese, nontender, nondistended, no obvious ascites, no peritoneal signs, normal bowel sounds, no  organomegaly Rectal: Omitted Extremities: no clubbing, cyanosis, or lower extremity edema bilaterally Skin: no lesions on visible extremities Neuro: No focal deficits.  Cranial nerves intact  ASSESSMENT:  1.  Chronic GERD.  Adequately controlled with Nexium 20 mg twice daily 2.  New onset intermittent solid food dysphagia.  Likely secondary to esophagitis and/or peptic stricture. 3.  Morbid obesity.  BMI 55.  Significant weight gain of 45 pounds over the past 2 years 4.  Chronic constipation.  Slow transit. 5.  Left upper quadrant discomfort with bending secondary to truncal obesity 6.  Poorly controlled diabetes mellitus 7.  Multiple general medical problems   PLAN:  1.  Outside records have been requested for review 2.  Schedule barium swallow with tablet to rule out peptic stricture 3.  Prescribe pantoprazole 40 mg twice daily.  Medication risks reviewed 4.  Advised to increase MiraLAX dosage to achieve desired result. 5.  Reflux precautions with attention to weight loss 6.  Importance of diabetic control stressed 7.  Routine GI office follow-up in 6 weeks A total time of 65 minutes was spent preparing to see the patient.  Reviewing outside tests and records, obtaining history and performing comprehensive physical examination.  Also educating the patient regarding her multitude of above listed relevant diagnoses for this visit, ordering advanced radiology tests and medications; and documenting clinical information in the health record

## 2019-10-14 ENCOUNTER — Ambulatory Visit (HOSPITAL_COMMUNITY)
Admission: RE | Admit: 2019-10-14 | Discharge: 2019-10-14 | Disposition: A | Discharge status: Home / Self Care | Payer: BC Managed Care – PPO | Source: Ambulatory Visit | Attending: Internal Medicine | Admitting: Internal Medicine

## 2019-10-14 DIAGNOSIS — K5900 Constipation, unspecified: Secondary | ICD-10-CM | POA: Insufficient documentation

## 2019-10-14 DIAGNOSIS — K219 Gastro-esophageal reflux disease without esophagitis: Secondary | ICD-10-CM | POA: Insufficient documentation

## 2019-10-14 DIAGNOSIS — K224 Dyskinesia of esophagus: Secondary | ICD-10-CM | POA: Diagnosis not present

## 2019-10-14 DIAGNOSIS — K449 Diaphragmatic hernia without obstruction or gangrene: Secondary | ICD-10-CM | POA: Diagnosis not present

## 2019-10-14 DIAGNOSIS — E669 Obesity, unspecified: Secondary | ICD-10-CM | POA: Insufficient documentation

## 2019-10-14 DIAGNOSIS — R1310 Dysphagia, unspecified: Secondary | ICD-10-CM | POA: Diagnosis not present

## 2019-10-17 ENCOUNTER — Other Ambulatory Visit: Payer: Self-pay

## 2019-10-17 MED ORDER — ONDANSETRON HCL 4 MG PO TABS: 4.0000 mg | ORAL_TABLET | Freq: Three times a day (TID) | ORAL | 0 refills | Status: DC | PRN

## 2019-11-03 ENCOUNTER — Other Ambulatory Visit: Payer: Self-pay

## 2019-11-07 ENCOUNTER — Encounter: Payer: Self-pay | Admitting: Internal Medicine

## 2019-11-07 ENCOUNTER — Ambulatory Visit: Payer: BC Managed Care – PPO | Admitting: Internal Medicine

## 2019-11-07 ENCOUNTER — Other Ambulatory Visit: Payer: Self-pay

## 2019-11-07 VITALS — BP 138/72 | HR 109 | Temp 98.7°F | Ht <= 58 in | Wt 243.4 lb

## 2019-11-07 DIAGNOSIS — E1165 Type 2 diabetes mellitus with hyperglycemia: Secondary | ICD-10-CM

## 2019-11-07 DIAGNOSIS — Z794 Long term (current) use of insulin: Secondary | ICD-10-CM

## 2019-11-07 DIAGNOSIS — E113299 Type 2 diabetes mellitus with mild nonproliferative diabetic retinopathy without macular edema, unspecified eye: Secondary | ICD-10-CM

## 2019-11-07 MED ORDER — LANTUS SOLOSTAR 100 UNIT/ML ~~LOC~~ SOPN: 40.0000 [IU] | PEN_INJECTOR | Freq: Every day | SUBCUTANEOUS | 11 refills | Status: DC

## 2019-11-07 MED ORDER — INSULIN LISPRO (1 UNIT DIAL) 100 UNIT/ML (KWIKPEN): 12.0000 [IU] | PEN_INJECTOR | Freq: Three times a day (TID) | SUBCUTANEOUS | 11 refills | Status: DC

## 2019-11-07 NOTE — Progress Notes (Signed)
Name: Nicole [name]  MRN/ DOB: [id number], 09/01/1965   Age/ Sex: 54 y.o., female    PCP: Sherrilee Gilles, DO   Reason for Endocrinology Evaluation: Type 2 Diabetes Mellitus     Date of Initial Endocrinology Visit: 11/07/2019     PATIENT IDENTIFIER: Nicole [name] is a 54 y.o. female with a past medical history of T2DM, HTN, Fibromyalgia. The patient presented for initial endocrinology clinic visit on 11/07/2019 for consultative assistance with her diabetes management.    HPI: Nicole [name] was    Diagnosed with DM 18 yrs ago  Prior Medications tried/Intolerance: Victoza started 07/2017, glipizide, metformin- GI side effects , basal insulin , Januvia  Currently checking blood sugars occasionally  Hypoglycemia episodes : no           Hemoglobin A1c has ranged from 12.7 % in 2020, peaking at 13.8 % in 2018. Patient required assistance for hypoglycemia: no  Patient has required hospitalization within the last 1 year from hyper or hypoglycemia: no   In terms of diet, the patient eats 2 meals a day . Snacks at least once a day. Drinks sugar-sweetened beverages.   Is having GI issues with heartburn , nausea and vomiting started in 2019 , was not able to eat much such as crackers and soup    HOME DIABETES REGIMEN: Humalog Mix 50 units BID  Victoza 1.2 mg daily    Statin: No  ACE-I/ARB: yes Prior Diabetic Education: Yes   METER DOWNLOAD SUMMARY:  This am 141 mg/dL    DIABETIC COMPLICATIONS: Microvascular complications:   Minimal DR , s/p caratract sx  Denies: neuropathy , CKD   Last eye exam: Completed 05/2019  Macrovascular complications:    Denies: CAD, PVD, CVA   PAST HISTORY: Past Medical History:  Past Medical History:  Diagnosis Date  . Depression   . Diabetes mellitus   . Fibromyalgia   . GERD (gastroesophageal reflux disease)   . HTN (hypertension)   . Obesity   . RLS (restless legs syndrome)   . Vitamin D deficiency   . Vitamin D  deficiency    Past Surgical History:  Past Surgical History:  Procedure Laterality Date  . CESAREAN SECTION    . TUBAL LIGATION        Social History:  reports that she has never smoked. She has never used smokeless tobacco. She reports that she does not drink alcohol or use drugs. Family History:  Family History  Problem Relation Age of Onset  . Heart disease Other   . Cancer Other   . Asthma Other   . Diabetes Other      HOME MEDICATIONS: Allergies as of 11/07/2019      Reactions   Metformin And Related       Medication List       Accurate as of November 07, 2019  1:25 PM. If you have any questions, ask your nurse or doctor.        Accu-Chek Guide w/Device Kit 1 Piece by Does not apply route as directed.   aspirin EC 81 MG tablet Take 81 mg by mouth daily.   DULoxetine 60 MG capsule Commonly known as: CYMBALTA Take 60 mg by mouth daily.   glucose blood test strip Commonly known as: Accu-Chek Guide Use as instructed   HumaLOG Mix 75/25 KwikPen (75-25) 100 UNIT/ML Kwikpen Generic drug: Insulin Lispro Prot & Lispro 50 Units. 3 times daily with meals   hydrochlorothiazide 12.5 MG capsule Commonly known  as: MICROZIDE Take 12.5 mg by mouth daily.   Insulin Glargine 300 UNIT/ML Sopn Commonly known as: Toujeo SoloStar Inject 40 Units into the skin at bedtime. What changed: how much to take   liraglutide 18 MG/3ML Sopn Commonly known as: Victoza Inject 0.2 mLs (1.2 mg total) into the skin daily.   losartan 100 MG tablet Commonly known as: COZAAR Take 100 mg by mouth daily.   ondansetron 4 MG tablet Commonly known as: ZOFRAN Take 1 tablet (4 mg total) by mouth every 8 (eight) hours as needed for nausea or vomiting.   pantoprazole 40 MG tablet Commonly known as: PROTONIX Take 1 tablet (40 mg total) by mouth 2 (two) times daily.   polyethylene glycol powder 17 GM/SCOOP powder Commonly known as: GLYCOLAX/MIRALAX Take 17 g by mouth daily.   pregabalin  75 MG capsule Commonly known as: LYRICA Take 75 mg by mouth 2 (two) times daily.   STOOL SOFTENER PO Take 2 tablets by mouth daily.        ALLERGIES: Allergies  Allergen Reactions  . Metformin And Related      REVIEW OF SYSTEMS: A comprehensive ROS was conducted with the patient and is negative except as per HPI and below:  Review of Systems  Gastrointestinal: Positive for diarrhea, heartburn and vomiting.  Musculoskeletal: Positive for joint pain.  Neurological: Positive for tingling.      OBJECTIVE:   VITAL SIGNS: BP 138/72 (BP Location: Left Arm, Patient Position: Sitting, Cuff Size: Large)   Pulse (!) 109   Temp 98.7 F (37.1 C)   Ht '4\' 8"'  (1.422 m)   Wt 243 lb 6.4 oz (110.4 kg)   LMP 04/07/2016   SpO2 97%   BMI 54.57 kg/m    PHYSICAL EXAM:  General: Pt appears well and is in NAD  Neck: General: Supple without adenopathy or carotid bruits. Thyroid: Thyroid size normal.  No goiter or nodules appreciated. No thyroid bruit.  Lungs: Clear with good BS bilat with no rales, rhonchi, or wheezes  Heart: RRR with normal S1 and S2 and no gallops; no murmurs; no rub  Abdomen: Normoactive bowel sounds, soft, nontender, without masses or organomegaly palpable  Extremities:  Lower extremities - No pretibial edema. No lesions.  Skin: Normal texture and temperature to palpation.   Neuro: MS is good with appropriate affect, pt is alert and Ox3    DM foot exam: 11/07/2019  The skin of the feet is intact without sores or ulcerations. The pedal pulses are 2+ on right and 2+ on left. The sensation is intact to a screening 5.07, 10 gram monofilament bilaterally   DATA REVIEWED:       01/17/2019 BUN/Cr 14/1.0 GFR 71  Tg 202 HDL 59 LDL 53 Mc/Alb ratio 10    09/2019 A1c 11.0% ASSESSMENT / PLAN / RECOMMENDATIONS:   1) Type 2 Diabetes Mellitus, poorly controlled, With retinopathic, and neuropathic complications - Most recent A1c of  11.0 %. Goal A1c < 7.0 %.     Plan: GENERAL: I have discussed with the patient the pathophysiology of diabetes. We went over the natural progression of the disease. We talked about both insulin resistance and insulin deficiency. We stressed the importance of lifestyle changes including diet and exercise. I explained the complications associated with diabetes including retinopathy, nephropathy, neuropathy as well as increased risk of cardiovascular disease. We went over the benefit seen with glycemic control.   I explained to the patient that diabetic patients are at higher than normal risk  for amputations.  Discussed pharmacokinetics of basal/bolus insulin and the importance of taking prandial insulin with meals.   We also discussed avoiding sugar-sweetened beverages and snacks, when possible.   I am going to stop her Victoza due to recent severe GI symptoms.  I am also going to stop the Humalog mix and switch her to a multiple daily injections of insulin.  I did explain to her that with this regimen there is a possibility that she will gain weight, I have encouraged her to work with lifestyle changes to help offset the weight gain.  We did discuss that with fibromyalgia and arthralgias she may benefit from aquatic exercises.   MEDICATIONS: - Stop Humalog MIX  - STOP VICTOZA  - Start Lantus 40 units at bedtime  - Start Huamlog 12 units with each meal , if you do not eat, you do not take Humalog. If you are going to eat a small meal , take 6 units only.    EDUCATION / INSTRUCTIONS:  BG monitoring instructions: Patient is instructed to check her blood sugars 3 times a day, before meals.  Call [facility] if: BG persistently < 70 or > 300. . I reviewed the Rule of 15 for the treatment of hypoglycemia in detail with the patient. Literature supplied.   2) Diabetic complications:   Eye: Does have known diabetic retinopathy. Under observation  Neuro/ Feet: Does have known diabetic peripheral  neuropathy based on symptoms of tingling and numbness.  Renal: Patient does  have known baseline CKD. She is  on an ACEI/ARB at present.up-to-date on urine albumin/creatinine ratio   3) Lipids: Patient was on a statin at some point, but has not been on any recently.  LDL is already at goal at 53 mg/DL.     I spent 50 minutes preparing to see the patient by review of recent labs, imaging and procedures, obtaining and reviewing separately obtained history, communicating with the patient, ordering medications, tests or procedures, and documenting clinical information in the EHR including the differential Dx, treatment, and any further evaluation and other management      Signed electronically by: Mack Guise, MD  [facility] Endocrinology  [facility] [address] [place], [place] [address] Phone: [phone] FAX: [phone]   CC: Sherrilee Gilles, [place] [address] Phone: [phone]  Fax: [phone]    Return to Endocrinology clinic as below: Future Appointments  Date Time Provider [place]  11/30/2019 10:40 AM Irene Shipper, MD [facility]

## 2019-11-07 NOTE — Patient Instructions (Addendum)
-   Stop Humalog MIX  - STOP VICTOZA  - Start Lantus 40 units at bedtime  - Start Huamlog 12 units with each meal , if you do not eat, you do not take Humalog. If you are going to eat a small meal , take 6 units only.     Choose healthy, lower carb lower calorie snacks: toss salad, cooked vegetables, cottage cheese, peanut butter, low fat cheese / string cheese, lower sodium deli meat, tuna salad or chicken salad     HOW TO TREAT LOW BLOOD SUGARS (Blood sugar LESS THAN 70 MG/DL)  Please follow the RULE OF 15 for the treatment of hypoglycemia treatment (when your (blood sugars are less than 70 mg/dL)    STEP 1: Take 15 grams of carbohydrates when your blood sugar is low, which includes:   3-4 GLUCOSE TABS  OR  3-4 OZ OF JUICE OR REGULAR SODA OR  ONE TUBE OF GLUCOSE GEL     STEP 2: RECHECK blood sugar in 15 MINUTES STEP 3: If your blood sugar is still low at the 15 minute recheck --> then, go back to STEP 1 and treat AGAIN with another 15 grams of carbohydrates.

## 2019-11-30 ENCOUNTER — Ambulatory Visit: Payer: BC Managed Care – PPO | Admitting: Internal Medicine

## 2019-12-16 ENCOUNTER — Telehealth: Payer: Self-pay | Admitting: Internal Medicine

## 2019-12-16 NOTE — Telephone Encounter (Signed)
Pt informed of adjustment

## 2019-12-16 NOTE — Telephone Encounter (Signed)
Patient called stating her blood sugar was 511 at 11:30 this morning, she says she can't get it down. Patient requests nurse/dr to give her a call as soon as possible. Patient ph# [phone] - she noticed after she got her first covid shot is when it started getting higher and she is worried to get her second shot (scheduled next Wednesday).

## 2019-12-16 NOTE — Telephone Encounter (Signed)
Readings are as follows: 5/19 331 a.m 300 p.m. 5/20 299 a.m. 325 noon 417 p.m. 5/21 365 a.m. 511 noon Pt currently taking lantus 40 units and humalog TID Please advise.

## 2020-01-06 ENCOUNTER — Encounter: Payer: Self-pay | Admitting: Internal Medicine

## 2020-01-06 ENCOUNTER — Other Ambulatory Visit: Payer: Self-pay

## 2020-01-06 ENCOUNTER — Ambulatory Visit: Payer: BC Managed Care – PPO | Admitting: Internal Medicine

## 2020-01-06 VITALS — BP 118/68 | HR 101 | Ht <= 58 in | Wt 237.2 lb

## 2020-01-06 DIAGNOSIS — Z794 Long term (current) use of insulin: Secondary | ICD-10-CM

## 2020-01-06 DIAGNOSIS — E113299 Type 2 diabetes mellitus with mild nonproliferative diabetic retinopathy without macular edema, unspecified eye: Secondary | ICD-10-CM | POA: Diagnosis not present

## 2020-01-06 DIAGNOSIS — E1165 Type 2 diabetes mellitus with hyperglycemia: Secondary | ICD-10-CM

## 2020-01-06 LAB — POCT GLYCOSYLATED HEMOGLOBIN (HGB A1C): Hemoglobin A1C: 12.9 % — AB (ref 4.0–5.6)

## 2020-01-06 MED ORDER — INSULIN LISPRO (1 UNIT DIAL) 100 UNIT/ML (KWIKPEN): 18.0000 [IU] | PEN_INJECTOR | Freq: Three times a day (TID) | SUBCUTANEOUS | 11 refills | Status: DC

## 2020-01-06 MED ORDER — LANTUS SOLOSTAR 100 UNIT/ML ~~LOC~~ SOPN: 50.0000 [IU] | PEN_INJECTOR | Freq: Every day | SUBCUTANEOUS | 11 refills | Status: DC

## 2020-01-06 NOTE — Progress Notes (Signed)
Name: Nicole [name]  Age/ Sex: 54 y.o., female   MRN/ DOB: [id number], 1966/02/05     PCP: Sherrilee Gilles, DO   Reason for Endocrinology Evaluation: Type 2 Diabetes Mellitus  Initial Endocrine Consultative Visit: 11/07/2019    PATIENT IDENTIFIER: Nicole [name] is a 54 y.o. female with a past medical history of T2DM, HTN, Fibromyalgia. The patient has followed with Endocrinology clinic since 11/07/2019 for consultative assistance with management of her diabetes.  DIABETIC HISTORY:  Nicole [name] was diagnosed with DM many years ago. Has been on variable glycemic agents glipizide, metformin- GI side effects , basal insulin , and Jauvia. Victoza was started in 07/2017. Marland Kitchen Her hemoglobin A1c has ranged from 12.7 % in 2020, peaking at 13.8 % in 2018.  On her initial visit to our clinic she had an A1c of 11.0%, she was on Humalog mix and Victoza.  We stopped Victoza at the patient was having an explained in GI issues that seems to concerned with this initiation of Victoza.  We also switched her Humalog mix to an MDI regimen.    SUBJECTIVE:   During the last visit (11/07/2019): A1c 11.0%.  Victoza was stopped, Humalog mixed was changed to basal/prandial regimen  Today (01/06/2020): [name] is here for follow-up on diabetes management. She checks her blood sugars occasionally  times daily. The patient has not had hypoglycemic episodes since the last clinic visit. Her digestive issues have improved since stopping the victoria  C/o fatigue and joint aches , had seen rheumatology in the past was put on steroids in the past.    HOME DIABETES REGIMEN:  Lantus 40 units at bedtime - forgets to take it 2 days a week  Huamlog 16 units with each meal - started     Statin: no . LDL 53 mg/dL  ACE-I/ARB: yes    METER DOWNLOAD SUMMARY: Unable to download  > 300    DIABETIC COMPLICATIONS: Microvascular complications:   Minimal DR , s/p caratract sx  Denies: neuropathy , CKD    Last eye exam: Completed 05/2019  Macrovascular complications:    Denies: CAD, PVD, CVA   HISTORY:  Past Medical History:  Past Medical History:  Diagnosis Date  . Depression   . Diabetes mellitus   . Fibromyalgia   . GERD (gastroesophageal reflux disease)   . HTN (hypertension)   . Obesity   . RLS (restless legs syndrome)   . Vitamin D deficiency   . Vitamin D deficiency    Past Surgical History:  Past Surgical History:  Procedure Laterality Date  . CATARACT EXTRACTION    . CESAREAN SECTION    . TUBAL LIGATION      Social History:  reports that she has never smoked. She has never used smokeless tobacco. She reports that she does not drink alcohol and does not use drugs. Family History:  Family History  Problem Relation Age of Onset  . Heart disease Other   . Cancer Other   . Asthma Other   . Diabetes Other      HOME MEDICATIONS: Allergies as of 01/06/2020      Reactions   Metformin And Related       Medication List       Accurate as of January 06, 2020  4:43 PM. If you have any questions, ask your nurse or doctor.        Accu-Chek Guide w/Device Kit 1 Piece by Does not apply route as directed.   aspirin  EC 81 MG tablet Take 81 mg by mouth daily.   DULoxetine 60 MG capsule Commonly known as: CYMBALTA Take 60 mg by mouth daily.   glucose blood test strip Commonly known as: Accu-Chek Guide Use as instructed   hydrochlorothiazide 12.5 MG capsule Commonly known as: MICROZIDE Take 12.5 mg by mouth daily.   insulin lispro 100 UNIT/ML KwikPen Commonly known as: HumaLOG KwikPen Inject 0.18 mLs (18 Units total) into the skin 3 (three) times daily. What changed: how much to take Changed by: Dorita Sciara, MD   Lantus SoloStar 100 UNIT/ML Solostar Pen Generic drug: insulin glargine Inject 50 Units into the skin daily. What changed: how much to take Changed by: Dorita Sciara, MD   losartan 100 MG tablet Commonly known as:  COZAAR Take 100 mg by mouth daily.   ondansetron 4 MG tablet Commonly known as: ZOFRAN Take 1 tablet (4 mg total) by mouth every 8 (eight) hours as needed for nausea or vomiting.   pantoprazole 40 MG tablet Commonly known as: PROTONIX Take 1 tablet (40 mg total) by mouth 2 (two) times daily.   polyethylene glycol powder 17 GM/SCOOP powder Commonly known as: GLYCOLAX/MIRALAX Take 17 g by mouth daily.   pregabalin 75 MG capsule Commonly known as: LYRICA Take 75 mg by mouth 2 (two) times daily.   STOOL SOFTENER PO Take 2 tablets by mouth daily.        OBJECTIVE:   Vital Signs: BP 118/68 (BP Location: Left Arm, Patient Position: Sitting, Cuff Size: Large)   Pulse (!) 101   Ht '4\' 8"'  (1.422 m)   Wt 237 lb 3.2 oz (107.6 kg)   LMP 04/07/2016   SpO2 99%   BMI 53.18 kg/m   Wt Readings from Last 3 Encounters:  01/06/20 237 lb 3.2 oz (107.6 kg)  11/07/19 243 lb 6.4 oz (110.4 kg)  10/13/19 244 lb (110.7 kg)     Exam: General: Pt appears well and is in NAD  Lungs: Clear with good BS bilat with no rales, rhonchi, or wheezes  Heart: RRR with normal S1 and S2 and no gallops; no murmurs; no rub  Abdomen: Normoactive bowel sounds, soft, nontender, without masses or organomegaly palpable  Extremities: No pretibial edema.  Neuro: MS is good with appropriate affect, pt is alert and Ox3   DM foot exam: 11/07/2019  The skin of the feet is intact without sores or ulcerations. The pedal pulses are 2+ on right and 2+ on left. The sensation is intact to a screening 5.07, 10 gram monofilament bilaterally     DATA REVIEWED:  Lab Results  Component Value Date   HGBA1C 12.9 (A) 01/06/2020   HGBA1C 13.8 05/19/2017            01/17/2019 BUN/Cr 14/1.0 GFR 71  Tg 202 HDL 59 LDL 53 Mc/Alb ratio 10    09/2019 A1c 11.0% ASSESSMENT / PLAN / RECOMMENDATIONS:   1) Type 2 Diabetes Mellitus, poorly controlled, With retinopathic, and neuropathic complications - Most recent A1c  of  12.9 %. Goal A1c < 7.0 %.    -Worsening glycemic control since her last visit, even though we have intensified her insulin regimen, part of this elevation is due to discontinuation of Victoza but I am not sure that this is enough to explain her hyperglycemia. -She does admit to imperfect adherence to medication -I will adjust her insulin regimen as below -I have encouraged her to continue to improve her lifestyle. -Patient is intolerant to Victoza  due to GI side effects  MEDICATIONS: - INcrease Lantus to 50 units daily  - Increase Humalog 18 units with each meal    EDUCATION / INSTRUCTIONS:  BG monitoring instructions: Patient is instructed to check her blood sugars 3 times a day, before meals   Call [place] [facility] if: BG persistently < 70 or > 300. . I reviewed the Rule of 15 for the treatment of hypoglycemia in detail with the patient. Literature supplied.   2) Diabetic complications:   Eye: Does have known diabetic retinopathy. Under observation  Neuro/ Feet: Does have known diabetic peripheral neuropathy based on symptoms of tingling and numbness.  Renal: Patient does  have known baseline CKD. She is  on an ACEI/ARB at present.up-to-date on urine albumin/creatinine ratio    F/U in 3 months   Signed electronically by: Mack Guise, MD  [facility] Endocrinology  [facility] [address] [place], [place] [address] Phone: [phone] FAX: [phone]   CC: Sherrilee Gilles, [place] [address] Phone: [phone]  Fax: [phone]  Return to Endocrinology clinic as below: Future Appointments  Date Time Provider [place]  04/09/2020  2:40 PM Sarabella Caprio, Melanie Crazier, MD [facility] None

## 2020-01-06 NOTE — Patient Instructions (Addendum)
-   INcrease Lantus to 50 units daily  - Increase Humalog 18 units with each meal      HOW TO TREAT LOW BLOOD SUGARS (Blood sugar LESS THAN 70 MG/DL)  Please follow the RULE OF 15 for the treatment of hypoglycemia treatment (when your (blood sugars are less than 70 mg/dL)    STEP 1: Take 15 grams of carbohydrates when your blood sugar is low, which includes:   3-4 GLUCOSE TABS  OR  3-4 OZ OF JUICE OR REGULAR SODA OR  ONE TUBE OF GLUCOSE GEL     STEP 2: RECHECK blood sugar in 15 MINUTES STEP 3: If your blood sugar is still low at the 15 minute recheck --> then, go back to STEP 1 and treat AGAIN with another 15 grams of carbohydrates.

## 2020-03-22 DIAGNOSIS — M797 Fibromyalgia: Secondary | ICD-10-CM | POA: Diagnosis not present

## 2020-03-22 DIAGNOSIS — G8929 Other chronic pain: Secondary | ICD-10-CM | POA: Diagnosis not present

## 2020-04-09 ENCOUNTER — Other Ambulatory Visit: Payer: Self-pay

## 2020-04-09 ENCOUNTER — Ambulatory Visit: Payer: BC Managed Care – PPO | Admitting: Internal Medicine

## 2020-04-09 ENCOUNTER — Encounter: Payer: Self-pay | Admitting: Internal Medicine

## 2020-04-09 VITALS — BP 140/78 | HR 92 | Ht <= 58 in | Wt 245.0 lb

## 2020-04-09 DIAGNOSIS — E1165 Type 2 diabetes mellitus with hyperglycemia: Secondary | ICD-10-CM | POA: Diagnosis not present

## 2020-04-09 DIAGNOSIS — E113299 Type 2 diabetes mellitus with mild nonproliferative diabetic retinopathy without macular edema, unspecified eye: Secondary | ICD-10-CM | POA: Diagnosis not present

## 2020-04-09 DIAGNOSIS — Z794 Long term (current) use of insulin: Secondary | ICD-10-CM

## 2020-04-09 LAB — POCT GLYCOSYLATED HEMOGLOBIN (HGB A1C): Hemoglobin A1C: 13.4 % — AB (ref 4.0–5.6)

## 2020-04-09 LAB — POCT GLUCOSE (DEVICE FOR HOME USE): Glucose Fasting, POC: 302 mg/dL — AB (ref 70–99)

## 2020-04-09 MED ORDER — TRESIBA FLEXTOUCH 100 UNIT/ML ~~LOC~~ SOPN: 50.0000 [IU] | PEN_INJECTOR | Freq: Every day | SUBCUTANEOUS | 6 refills | Status: DC

## 2020-04-09 MED ORDER — ACCU-CHEK GUIDE VI STRP: 1.0000 | ORAL_STRIP | Freq: Four times a day (QID) | 12 refills | Status: DC

## 2020-04-09 NOTE — Patient Instructions (Signed)
-   Continue  Lantus 50 units daily ( will try and change this depending on insurance coverage ?Evaristo Bury) - Increase Humalog 22 units with each meal      HOW TO TREAT LOW BLOOD SUGARS (Blood sugar LESS THAN 70 MG/DL)  Please follow the RULE OF 15 for the treatment of hypoglycemia treatment (when your (blood sugars are less than 70 mg/dL)    STEP 1: Take 15 grams of carbohydrates when your blood sugar is low, which includes:   3-4 GLUCOSE TABS  OR  3-4 OZ OF JUICE OR REGULAR SODA OR  ONE TUBE OF GLUCOSE GEL     STEP 2: RECHECK blood sugar in 15 MINUTES STEP 3: If your blood sugar is still low at the 15 minute recheck --> then, go back to STEP 1 and treat AGAIN with another 15 grams of carbohydrates.

## 2020-04-09 NOTE — Progress Notes (Signed)
Name: Nicole [name]  Age/ Sex: 54 y.o., female   MRN/ DOB: [id number], 10/23/1965     PCP: Sherrilee Gilles, DO   Reason for Endocrinology Evaluation: Type 2 Diabetes Mellitus  Initial Endocrine Consultative Visit: 11/07/2019    PATIENT IDENTIFIER: Nicole [name] is a 54 y.o. female with a past medical history of T2DM, HTN, Fibromyalgia. The patient has followed with Endocrinology clinic since 11/07/2019 for consultative assistance with management of her diabetes.  DIABETIC HISTORY:  Nicole [name] was diagnosed with DM many years ago. Has been on variable glycemic agents glipizide, metformin- GI side effects , basal insulin , and Jauvia. Victoza was started in 07/2017. Marland Kitchen Her hemoglobin A1c has ranged from 12.7 % in 2020, peaking at 13.8 % in 2018.  On her initial visit to our clinic she had an A1c of 11.0 %, she was on Humalog mix and Victoza.  We stopped Victoza as the patient was having an unexplained  GI issues that seems to concerned with this initiation of Victoza.  We also switched her Humalog mix to an MDI regimen.    SUBJECTIVE:   During the last visit (01/06/2020): A1c 12.9 %.  We increased MDI regimen   Today (04/09/2020): Nicole [name] is here for follow-up on diabetes management. She checks her blood sugars 0 daily . The patient has not had hypoglycemic episodes since the last clinic visit. Her digestive issues have improved since stopping the victoria   C/O knee pain and unable to exercise  Today she ate 2 pm : shrimp, okra and some mac and cheese, sweet tea.          HOME DIABETES REGIMEN:  Lantus 50 units at bedtime  Humalog 18 units with each meal     Statin: no . LDL 53 mg/dL  ACE-I/ARB: yes    METER DOWNLOAD SUMMARY: did not bring     DIABETIC COMPLICATIONS: Microvascular complications:   Minimal DR , s/p caratract sx  Denies: neuropathy , CKD   Last eye exam: Completed 05/2019  Macrovascular complications:    Denies: CAD, PVD,  CVA   HISTORY:  Past Medical History:  Past Medical History:  Diagnosis Date  . Depression   . Diabetes mellitus   . Fibromyalgia   . GERD (gastroesophageal reflux disease)   . HTN (hypertension)   . Obesity   . RLS (restless legs syndrome)   . Vitamin D deficiency   . Vitamin D deficiency    Past Surgical History:  Past Surgical History:  Procedure Laterality Date  . CATARACT EXTRACTION    . CESAREAN SECTION    . TUBAL LIGATION      Social History:  reports that she has never smoked. She has never used smokeless tobacco. She reports that she does not drink alcohol and does not use drugs. Family History:  Family History  Problem Relation Age of Onset  . Heart disease Other   . Cancer Other   . Asthma Other   . Diabetes Other      HOME MEDICATIONS: Allergies as of 04/09/2020      Reactions   Metformin And Related       Medication List       Accurate as of April 09, 2020  2:50 PM. If you have any questions, ask your nurse or doctor.        Accu-Chek Guide w/Device Kit 1 Piece by Does not apply route as directed.   aspirin EC 81 MG tablet Take 81  mg by mouth daily.   DULoxetine 60 MG capsule Commonly known as: CYMBALTA Take 60 mg by mouth daily.   glucose blood test strip Commonly known as: Accu-Chek Guide Use as instructed   hydrochlorothiazide 12.5 MG capsule Commonly known as: MICROZIDE Take 12.5 mg by mouth daily.   insulin lispro 100 UNIT/ML KwikPen Commonly known as: HumaLOG KwikPen Inject 0.18 mLs (18 Units total) into the skin 3 (three) times daily.   Lantus SoloStar 100 UNIT/ML Solostar Pen Generic drug: insulin glargine Inject 50 Units into the skin daily.   losartan 100 MG tablet Commonly known as: COZAAR Take 100 mg by mouth daily.   ondansetron 4 MG tablet Commonly known as: ZOFRAN Take 1 tablet (4 mg total) by mouth every 8 (eight) hours as needed for nausea or vomiting.   pantoprazole 40 MG tablet Commonly known as:  PROTONIX Take 1 tablet (40 mg total) by mouth 2 (two) times daily.   polyethylene glycol powder 17 GM/SCOOP powder Commonly known as: GLYCOLAX/MIRALAX Take 17 g by mouth daily.   pregabalin 75 MG capsule Commonly known as: LYRICA Take 75 mg by mouth 2 (two) times daily.   STOOL SOFTENER PO Take 2 tablets by mouth daily.        OBJECTIVE:   Vital Signs: BP 140/78 (BP Location: Left Arm, Patient Position: Sitting, Cuff Size: Large)   Pulse 92   Ht _0  (1.422 m)   Wt 245 lb (111.1 kg)   LMP 04/07/2016   SpO2 99%   BMI 54.93 kg/m   Wt Readings from Last 3 Encounters:  04/09/20 245 lb (111.1 kg)  01/06/20 237 lb 3.2 oz (107.6 kg)  11/07/19 243 lb 6.4 oz (110.4 kg)     Exam: General: Pt appears well and is in NAD  Lungs: Clear with good BS bilat with no rales, rhonchi, or wheezes  Heart: RRR with normal S1 and S2 and no gallops; no murmurs; no rub  Abdomen: Normoactive bowel sounds, soft, nontender, without masses or organomegaly palpable  Extremities: No pretibial edema.  Neuro: MS is good with appropriate affect, pt is alert and Ox3   DM foot exam: 11/07/2019  The skin of the feet is intact without sores or ulcerations. The pedal pulses are 2+ on right and 2+ on left. The sensation is intact to a screening 5.07, 10 gram monofilament bilaterally     DATA REVIEWED:  Lab Results  Component Value Date   HGBA1C 13.4 (A) 04/09/2020   HGBA1C 12.9 (A) 01/06/2020   HGBA1C 13.8 05/19/2017     01/17/2019 BUN/Cr 14/1.0 GFR 71  Tg 202 HDL 59 LDL 53 Mc/Alb ratio 10    09/2019 A1c 11.0%   In-office BG 302 mg/dL   ASSESSMENT / PLAN / RECOMMENDATIONS:   1) Type 2 Diabetes Mellitus, poorly controlled, With retinopathic, and neuropathic complications - Most recent A1c of  13.4 %. Goal A1c < 7.0 %.    -Pt continues with worsening glycemic control , she assures me adherence but an A1c of 13.4 % is NOT consistent with taking 104 units daily of insulin, unless  injection technique is inaccurate or pt consuming more CHO than a 104 units of insulin could cover, but the pt is also under the impression she has cut out CHO intake and tremendously and tell me her spouse is worried about her "low intake ". When I reviewed what she had eaten before her visit here today, she had okra, shrimp, mac and cheese and sweet tea  and forgot to take her 18 units of prandial insulin  - When I counseled the pt about avoiding sugar-sweetened beverages she tell me due to fibromyalgia she is unable to drink sweeteners as they exacerbate her pain - Based on all the above , the pt will be referred to our RD  - We also discussed the V-Go pump and she is interested.  -Patient is intolerant to Victoza due to GI side effects - Pt under the impression, lantus does not work for her and requesting a change, will try [place]   MEDICATIONS: - Continue  Lantus  50 units daily  - Increase Humalog 22 units with each meal    EDUCATION / INSTRUCTIONS:  BG monitoring instructions: Patient is instructed to check her blood sugars 3 times a day, before meals   Call [facility] if: BG persistently < 70  . I reviewed the Rule of 15 for the treatment of hypoglycemia in detail with the patient. Literature supplied.   2) Diabetic complications:   Eye: Does have known diabetic retinopathy. Under observation  Neuro/ Feet: Does have known diabetic peripheral neuropathy based on symptoms of tingling and numbness.  Renal: Patient does  have known baseline CKD. She is  on an ACEI/ARB at present.up-to-date on urine albumin/creatinine ratio    F/U in 3 months   Signed electronically by: Mack Guise, MD  [facility]  [facility] [address] [place], [place] [address] Phone: [phone] FAX: [phone]   CC: Sherrilee Gilles, [place] [address] Phone: [phone]  Fax: [phone]  Return to  Endocrinology clinic as below: No future appointments.

## 2020-04-12 ENCOUNTER — Telehealth: Payer: Self-pay | Admitting: Internal Medicine

## 2020-04-12 NOTE — Telephone Encounter (Signed)
Pt is aware of Nicole [name] not being covered by her insurance.  She will call us back with an alternate

## 2020-04-13 ENCOUNTER — Telehealth: Payer: Self-pay

## 2020-04-13 NOTE — Telephone Encounter (Signed)
Do you want to do PA or change medication?

## 2020-04-13 NOTE — Telephone Encounter (Signed)
PA initiated via [email or web address] for [place].    Nicole [name] (Key: Nicole [name]) Nicole [name] FlexTouch (insulin degludec injection) 100 Units/mL solution   Form [employer] Electronic Prior Authorization Form (2017 NCPDP) Created 12 minutes ago Sent to Plan 10 minutes ago Plan Response 9 minutes ago Submit Clinical Questions 3 minutes ago Determination Wait for Determination Please wait for [employer] 2017 NCPDP to return a determination.  Nicole [name] (KeyPage [name])  [employer] is reviewing your PA request. Typically an electronic response will be received within 72 hours. To check for an update later, open this request from your dashboard.  You may close this dialog and return to your dashboard to perform other tasks.

## 2020-04-13 NOTE — Telephone Encounter (Signed)
PA initiated. See PA note.

## 2020-04-13 NOTE — Telephone Encounter (Signed)
Pt needs prior authorization for [place].   She said you need to call [phone].

## 2020-04-19 ENCOUNTER — Telehealth: Payer: Self-pay

## 2020-04-19 ENCOUNTER — Telehealth: Payer: Self-pay | Admitting: Internal Medicine

## 2020-04-19 NOTE — Telephone Encounter (Signed)
Please note pharmacy of record is not in [place], [place] it is in [place], [place] - see information below   [facility]  [address] ([address] [employer]) [employer]  Phone: # [phone]  Pharmacy has already pulled RX from [place], [place] we just need to update our record

## 2020-04-19 NOTE — Telephone Encounter (Signed)
Patient advise to take [place] as follows:Inject 50 Units into the skin daily. Patient verbalized understanding

## 2020-04-19 NOTE — Telephone Encounter (Signed)
New message    The patient is asking for direction / clarification on insulin degludec (TRESIBA FLEXTOUCH) 100 UNIT/ML FlexTouch Pen.   Please advise.

## 2020-06-05 DIAGNOSIS — J0110 Acute frontal sinusitis, unspecified: Secondary | ICD-10-CM | POA: Diagnosis not present

## 2020-06-05 DIAGNOSIS — J069 Acute upper respiratory infection, unspecified: Secondary | ICD-10-CM | POA: Diagnosis not present

## 2020-06-12 ENCOUNTER — Other Ambulatory Visit: Payer: Self-pay

## 2020-06-12 ENCOUNTER — Encounter: Payer: BC Managed Care – PPO | Attending: Internal Medicine | Admitting: Nutrition

## 2020-06-12 DIAGNOSIS — Z794 Long term (current) use of insulin: Secondary | ICD-10-CM | POA: Diagnosis not present

## 2020-06-12 DIAGNOSIS — E1165 Type 2 diabetes mellitus with hyperglycemia: Secondary | ICD-10-CM | POA: Insufficient documentation

## 2020-06-13 NOTE — Progress Notes (Signed)
Patient was identified by name and DOB.  She is here today with her daughter, to learn about the V-go.  She admits that she does not take her insulin all of the time, because she is either not home, or can not afford this.  She has been off her Evaristo Bury for 2 days, and is going today to pick it up from the pharmacy.  She reports that her Humalog is less expensive, and was happy to hear that she will not need the [place], if starting the  V-go.  She was excited to see the V-Go and feels like she can do this every day.  Paperwork was filled out and faxed to [employer] with insurance information.  She will wait to hear from them about cost, and let me know if she wants to start this.  She was given a copay card to use, and had no final questions. Due to timing, we did not have time to discuss diet in detail.  We discussed the important issues of not drinking sweet drinks, something that she says she has "almost given up".  She is not drinking fruit juices and cold cereal and milk. She admits that she eats a large bedtime snack and a sheet was given for 100=150 calorie options for this. Also discussed the idea of a balanced meal, and the need for all 3 major food groups at each meal, watching portion sizes at each meal.  She wa grateful this information and agreed to call me when hears back from [employer]. She admits that she is not exercising due to muscle aches.  She is seeing a MD tomorrow about this.  Suggested chair exercises that will help to make her insulin to work better.

## 2020-06-13 NOTE — Patient Instructions (Addendum)
Stop sweet drinks Call for an appointment with me, when you decide if you want to have a free trial of the V-Go.  [phone] LImit bedtime snacking to no more than 150 calories at night Try to do 15-20 min. Of chair exercises as shown.

## 2020-06-15 DIAGNOSIS — E113411 Type 2 diabetes mellitus with severe nonproliferative diabetic retinopathy with macular edema, right eye: Secondary | ICD-10-CM | POA: Diagnosis not present

## 2020-06-26 ENCOUNTER — Telehealth: Payer: Self-pay | Admitting: Nutrition

## 2020-06-26 NOTE — Telephone Encounter (Signed)
I received a call from V-go that the patient is not returning their call.  They have an estimate for the cost of the V-Gos for her. I phoned her and gave her their number and told her that we are able to do a 6 day free trial if she can afford this.  She voiced understanding of this, and said she would call her and call me back if interested.

## 2020-07-05 NOTE — Progress Notes (Signed)
Office Visit Note  Patient: Nicole [name]             Date of Birth: 1965-11-08           MRN: [id number]             PCP: Jonathon Bellows, DO Referring: Jonathon Bellows, DO Visit Date: 07/06/2020 Occupation: Teacher  Subjective:  New Patient (Initial Visit)   History of Present Illness: Nicole [name] is a 54 y.o. female with a history of type 2 diabetes, retinopathy, obesity, vitamin D deficiency, and fibromyalgia here for evaluation of joint pain in multiple sites and elevated sedimentation rate.  She has had chronic pain for many years with fibromyalgia and diffuse symptoms.  However in the past couple months she has been experiencing significant worsening hand pain and especially right knee and ankle pain.  This worsens throughout the day and cannot tolerate a significant weightbearing activities without feeling severe pain and debility the following day.  She is started using crutches to reduce weight on the right leg.  She has had limited benefit with over-the-counter treatments.  She reports multiple falls she attributes to a feeling of weakness in the right leg and inability to tolerate pressure.  She has noticed nodule on the back of the right Achilles tendon that is very painful when being rubbed by footwear.  Pain is increased with walking on stairs.  Her knee pain is worse in the right knee along the medial joint line and she cannot tolerate full flexion of the knee without significant worsening.  She does think there is some swelling over the proximal joints of her hands and her lower extremities.  She has noticed some central facial erythema but not persistent.  She has dry eyes and dry mouth denies oral ulcers or lesions.  No other peripheral skin rashes.  No history of blood clots.  Unclear if she has any family history of autoimmune disease she strongly suspects on her mother side sometimes inflammatory disease that was not definitively diagnosed.   Activities of Daily Living:   Patient reports morning stiffness for 1-1.5 hour.   Patient Reports nocturnal pain.  Difficulty dressing/grooming: Reports Difficulty climbing stairs: Reports Difficulty getting out of chair: Reports Difficulty using hands for taps, buttons, cutlery, and/or writing: Reports  Review of Systems  Constitutional: Positive for fatigue.  HENT: Positive for mouth dryness. Negative for mouth sores and nose dryness.   Eyes: Positive for pain, itching, visual disturbance and dryness.  Respiratory: Negative for cough, hemoptysis, shortness of breath and difficulty breathing.   Cardiovascular: Positive for swelling in legs/feet. Negative for chest pain and palpitations.  Gastrointestinal: Negative for abdominal pain, blood in stool, constipation and diarrhea.  Endocrine: Positive for increased urination.  Genitourinary: Positive for painful urination.  Musculoskeletal: Positive for arthralgias, joint pain, joint swelling, myalgias, muscle weakness, morning stiffness, muscle tenderness and myalgias.  Skin: Positive for rash. Negative for color change and redness.  Allergic/Immunologic: Negative for susceptible to infections.  Neurological: Positive for dizziness and weakness. Negative for numbness, headaches and memory loss.  Hematological: Positive for swollen glands.  Psychiatric/Behavioral: Positive for sleep disturbance. Negative for confusion.    PMFS History:  Patient Active Problem List   Diagnosis Date Noted  . Elevated sed rate 07/06/2020  . Pain in right knee 07/06/2020  . Bilateral hand pain 07/06/2020  . Type 2 diabetes mellitus with hyperglycemia, with long-term current use of insulin (HCC) 11/07/2019  . Type 2 diabetes mellitus with mild  nonproliferative retinopathy, with long-term current use of insulin (HCC) 11/07/2019  . Uncontrolled type 2 diabetes mellitus with hyperglycemia (HCC) 06/29/2017  . Essential hypertension, benign 06/29/2017  . Morbid obesity (HCC) 06/29/2017  .  Achilles tendinitis 05/08/2011    Past Medical History:  Diagnosis Date  . Depression   . Diabetes mellitus   . Fibromyalgia   . GERD (gastroesophageal reflux disease)   . HTN (hypertension)   . Obesity   . RLS (restless legs syndrome)   . Vitamin D deficiency   . Vitamin D deficiency     Family History  Problem Relation Age of Onset  . Heart disease Other   . Cancer Other   . Asthma Other   . Diabetes Other   . Arthritis Mother    Past Surgical History:  Procedure Laterality Date  . CATARACT EXTRACTION    . CESAREAN SECTION    . TUBAL LIGATION     Social History   Social History Narrative  . Not on file   Immunization History  Administered Date(s) Administered  . Moderna SARS-COVID-2 Vaccination 11/23/2019, 12/21/2019     Objective: Vital Signs: BP (!) 150/102 (BP Location: Left Arm, Patient Position: Sitting, Cuff Size: Small)   Pulse (!) 105   Ht 5' 0.25" (1.53 m)   Wt 247 lb 6.4 oz (112.2 kg)   LMP 04/07/2016   BMI 47.92 kg/m    Physical Exam Constitutional:      Appearance: She is obese.  HENT:     Right Ear: External ear normal.     Left Ear: External ear normal.     Mouth/Throat:     Mouth: Mucous membranes are moist.     Pharynx: Oropharynx is clear.  Eyes:     Conjunctiva/sclera: Conjunctivae normal.  Cardiovascular:     Rate and Rhythm: Normal rate and regular rhythm.  Pulmonary:     Effort: Pulmonary effort is normal.     Breath sounds: Normal breath sounds.  Skin:    General: Skin is warm and dry.     Findings: No rash.  Neurological:     General: No focal deficit present.     Mental Status: She is alert.  Psychiatric:     Comments: Tearful discussing symptom interference with daily activities     Musculoskeletal Exam:  Neck full range of motion tenderness on right side Right shoulder pain with abduction overhead but passive range of motion is intact left side normal Right elbow tenderness over olecranon around cubital tunnel  without swelling Wrist, fingers full range of motion tenderness to palpation over right MCP joints 1 through 3 Upper back paraspinal muscle tenderness severely bunched and tight muscle over right trapezius above the scapula with tenderness to palpation or with passive extension Left knee patellofemoral crepitus with intact range of motion, right knee severely painful guarding against passive extension flexion but range of motion near normal no significant effusion erythema or warmth seen Right ankle range of motion intact but tenderness to pressure over Achilles tendon of the ankle and enthesis, ultrasound spectrum reveals tendon hypoechogenicity and edema without color enhancement, visible tear, or retraction    Investigation: No additional findings.  Imaging: XR Hand 2 View Left  Result Date: 07/06/2020 X-ray left hand 2 views Radiocarpal and carpal joint spaces appear normal.  Normal MCP PIP and DIP joint spaces throughout.  No significant osteophytes or erosions seen.  Bone mineralization appears normal.  No soft tissue swelling is seen. Impression No significant arthritis abnormalities  XR Hand 2 View Right  Result Date: 07/06/2020 X-ray right hand 2 views Radiocarpal carpal joint spaces appear normal.  MCP PIP and DIP joint spaces appear normal throughout.  No significant osteophytes or erosions.  Bone mineralization appears normal.  No soft tissue swelling seen. Impression No significant arthritis abnormality seen  XR KNEE 3 VIEW LEFT  Result Date: 07/06/2020 X-ray left knee 3 views Significant medial compartment joint space narrowing and subchondral sclerosis of tibia.  Patellar enthesophytes on superior aspect.  No significant joint line osteophytes seen.  No soft tissue swelling or calcifications. Impression Mild to moderate medial compartment knee OA  XR KNEE 3 VIEW RIGHT  Result Date: 07/06/2020 X-ray right knee 3 views Medial compartment joint space narrowing and increase  subchondral sclerosis on tibia.  Enthesophyte formation on superior aspect of patella. No significant joint line osteophytes.  No soft tissue swelling or calcifications. Impression Mild to moderate medial compartment knee OA   Recent Labs: No results found for: WBC, HGB, PLT, NA, K, CL, CO2, GLUCOSE, BUN, CREATININE, BILITOT, ALKPHOS, AST, ALT, PROT, ALBUMIN, CALCIUM, GFRAA, QFTBGOLD, QFTBGOLDPLUS  Speciality Comments: No specialty comments available.  Procedures:  No procedures performed Allergies: Metformin and related   Assessment / Plan:     Visit Diagnoses: Elevated sed rate - Plan: XR Hand 2 View Right, XR Hand 2 View Left, XR KNEE 3 VIEW RIGHT, XR KNEE 3 VIEW LEFT, ANA, Anti-Smith antibody, Sjogrens syndrome-A extractable nuclear antibody, Sjogrens syndrome-B extractable nuclear antibody, Anti-DNA antibody, double-stranded, CBC with Differential/Platelet  Symptoms polyarthralgia and elevated sedimentation rate but no changes of inflammatory arthritis seen on physical exam x-ray or ultrasound inspection.  History is more consistent with degenerative arthritis worsening with use and not active synovitis.  She is very concerned for underlying autoimmune disease with a questionable family history.  We will obtain ANA IFA also Smith, SSA, SSB, dsDNA antibodies blood cell counts but only significantly abnormal would have low pretest suspicion for active autoimmune disease.  Achilles tendinitis of right lower extremity  Right Achilles tendon pain inspection ultrasound looks like tendinopathy or tendinitis without inflammatory enthesitis process.  This could be overuse or mechanical type issue.  Chronic pain of right knee Bilateral hand pain  Bilateral hand and knee pain with the biggest problems from the knees and more on right.  Exam demonstrates patellofemoral crepitus and medial joint line tenderness otherwise not very significant findings.  I am suspicious for osteoarthritis rather than  inflammatory process based on findings and this is consistent with the x-rays.  She may benefit from further management through Ortho or sports medicine also therapy or intra-articular injections to be next conservative treatment.  Orders: Orders Placed This Encounter  Procedures  . XR Hand 2 View Right  . XR Hand 2 View Left  . XR KNEE 3 VIEW RIGHT  . XR KNEE 3 VIEW LEFT  . ANA  . Anti-Smith antibody  . Sjogrens syndrome-A extractable nuclear antibody  . Sjogrens syndrome-B extractable nuclear antibody  . Anti-DNA antibody, double-stranded  . CBC with Differential/Platelet   No orders of the defined types were placed in this encounter.   Follow-Up Instructions: Return if symptoms worsen or fail to improve.   Fuller Plan, MD  Note - This record has been created using [employer].  Chart creation errors have been sought, but may not always  have been located. Such creation errors do not reflect on  the standard of medical care.

## 2020-07-06 ENCOUNTER — Ambulatory Visit: Payer: Self-pay

## 2020-07-06 ENCOUNTER — Other Ambulatory Visit: Payer: Self-pay

## 2020-07-06 ENCOUNTER — Ambulatory Visit: Payer: BC Managed Care – PPO | Admitting: Internal Medicine

## 2020-07-06 ENCOUNTER — Encounter: Payer: Self-pay | Admitting: Internal Medicine

## 2020-07-06 VITALS — BP 150/102 | HR 105 | Ht 60.25 in | Wt 247.4 lb

## 2020-07-06 DIAGNOSIS — M25561 Pain in right knee: Secondary | ICD-10-CM

## 2020-07-06 DIAGNOSIS — M7661 Achilles tendinitis, right leg: Secondary | ICD-10-CM | POA: Diagnosis not present

## 2020-07-06 DIAGNOSIS — M79641 Pain in right hand: Secondary | ICD-10-CM | POA: Diagnosis not present

## 2020-07-06 DIAGNOSIS — M79642 Pain in left hand: Secondary | ICD-10-CM

## 2020-07-06 DIAGNOSIS — G8929 Other chronic pain: Secondary | ICD-10-CM | POA: Diagnosis not present

## 2020-07-06 DIAGNOSIS — M25562 Pain in left knee: Secondary | ICD-10-CM

## 2020-07-06 DIAGNOSIS — R700 Elevated erythrocyte sedimentation rate: Secondary | ICD-10-CM

## 2020-07-06 NOTE — Patient Instructions (Signed)
I recommend checking out the [place] fibromyalgia patient centered care guide for recommendations on activity and lifestyle: [email or web address]

## 2020-07-09 ENCOUNTER — Ambulatory Visit: Payer: BC Managed Care – PPO | Admitting: Internal Medicine

## 2020-07-09 LAB — SJOGRENS SYNDROME-B EXTRACTABLE NUCLEAR ANTIBODY: SSB (La) (ENA) Antibody, IgG: 2.1 AI — AB

## 2020-07-09 LAB — CBC WITH DIFFERENTIAL/PLATELET
Absolute Monocytes: 525 cells/uL (ref 200–950)
Basophils Absolute: 86 cells/uL (ref 0–200)
Basophils Relative: 1 %
Eosinophils Absolute: 69 cells/uL (ref 15–500)
Eosinophils Relative: 0.8 %
HCT: 40.4 % (ref 35.0–45.0)
Hemoglobin: 13 g/dL (ref 11.7–15.5)
Lymphs Abs: 4025 cells/uL — ABNORMAL HIGH (ref 850–3900)
MCH: 28.1 pg (ref 27.0–33.0)
MCHC: 32.2 g/dL (ref 32.0–36.0)
MCV: 87.3 fL (ref 80.0–100.0)
MPV: 12.9 fL — ABNORMAL HIGH (ref 7.5–12.5)
Monocytes Relative: 6.1 %
Neutro Abs: 3896 cells/uL (ref 1500–7800)
Neutrophils Relative %: 45.3 %
Platelets: 260 10*3/uL (ref 140–400)
RBC: 4.63 10*6/uL (ref 3.80–5.10)
RDW: 12.8 % (ref 11.0–15.0)
Total Lymphocyte: 46.8 %
WBC: 8.6 10*3/uL (ref 3.8–10.8)

## 2020-07-09 LAB — ANTI-SMITH ANTIBODY: ENA SM Ab Ser-aCnc: <1 AI

## 2020-07-09 LAB — ANA: Anti Nuclear Antibody (ANA): POSITIVE — AB

## 2020-07-09 LAB — ANTI-NUCLEAR AB-TITER (ANA TITER): ANA Titer 1: 1:320 {titer} — ABNORMAL HIGH

## 2020-07-09 LAB — SJOGRENS SYNDROME-A EXTRACTABLE NUCLEAR ANTIBODY: SSA (Ro) (ENA) Antibody, IgG: <1 AI

## 2020-07-09 LAB — ANTI-DNA ANTIBODY, DOUBLE-STRANDED: ds DNA Ab: <1 IU/mL

## 2020-07-09 NOTE — Progress Notes (Deleted)
Name: Nicole [name]  Age/ Sex: 54 y.o., female   MRN/ DOB: [id number], 18-May-1966     PCP: Nicole Gilles, DO   Reason for Endocrinology Evaluation: Type 2 Diabetes Mellitus  Initial Endocrine Consultative Visit: 11/07/2019    [name] IDENTIFIER: Nicole [name] is a 54 y.o. female with a past medical history of T2DM, HTN, Fibromyalgia. Nicole [name] has followed with Endocrinology clinic since 11/07/2019 for consultative assistance with management of her diabetes.  DIABETIC HISTORY:  Nicole [name] was diagnosed with DM many years ago. Has been on variable glycemic agents glipizide, metformin- GI side effects , basal insulin , and Jauvia. Victoza was started in 07/2017. Marland Kitchen Her hemoglobin A1c has ranged from 12.7 % in 2020, peaking at 13.8 % in 2018.  On her initial visit to our clinic she had an A1c of 11.0 %, she was on Humalog mix and Victoza.  We stopped Victoza as Nicole [name] was having an unexplained  GI issues that seems to concerned with this initiation of Victoza.  We also switched her Humalog mix to an MDI regimen.    SUBJECTIVE:   During Nicole last visit (03/30/2020): A1c 13.4 %.  We increased MDI regimen   Today (07/09/2020): Nicole [name] is here for follow-up on diabetes management. She checks her blood sugars 0 daily . Nicole [name] has not had hypoglycemic episodes since Nicole last clinic visit. Her digestive issues have improved since stopping Nicole victoria   C/O knee pain and unable to exercise  Today she ate 2 pm : shrimp, okra and some mac and cheese, sweet tea.          HOME DIABETES REGIMEN:  Lantus 50 units at bedtime  Humalog 22 units with each meal     Statin: no . LDL 53 mg/dL  ACE-I/ARB: yes    METER DOWNLOAD SUMMARY: did not bring     DIABETIC COMPLICATIONS: Microvascular complications:   Minimal DR , s/p caratract sx  Denies: neuropathy , CKD   Last eye exam: Completed 05/2019  Macrovascular complications:    Denies: CAD, PVD,  CVA   HISTORY:  Past Medical History:  Past Medical History:  Diagnosis Date  . Depression   . Diabetes mellitus   . Fibromyalgia   . GERD (gastroesophageal reflux disease)   . HTN (hypertension)   . Obesity   . RLS (restless legs syndrome)   . Vitamin D deficiency   . Vitamin D deficiency    Past Surgical History:  Past Surgical History:  Procedure Laterality Date  . CATARACT EXTRACTION    . CESAREAN SECTION    . TUBAL LIGATION      Social History:  reports that she has never smoked. She has never used smokeless tobacco. She reports that she does not drink alcohol and does not use drugs. Family History:  Family History  Problem Relation Age of Onset  . Heart disease Other   . Cancer Other   . Asthma Other   . Diabetes Other   . Arthritis Mother      HOME MEDICATIONS: Allergies as of 07/09/2020      Reactions   Metformin And Related       Medication List       Accurate as of July 09, 2020  7:14 AM. If you have any questions, ask your nurse or doctor.        Accu-Chek Guide test strip Generic drug: glucose blood 1 each by Other route in Nicole morning, at  noon, in Nicole evening, and at bedtime. Use as instructed   Accu-Chek Guide w/Device Kit 1 Piece by Does not apply route as directed.   aspirin EC 81 MG tablet Take 81 mg by mouth daily.   Cholecalciferol 125 MCG (5000 UT) Tabs Take 5,000 Units by mouth daily.   cyanocobalamin 1000 MCG tablet Take 1,000 mcg by mouth daily.   cyclobenzaprine 5 MG tablet Commonly known as: FLEXERIL Take 5 mg by mouth 3 (three) times daily as needed.   DULoxetine 60 MG capsule Commonly known as: CYMBALTA Take 60 mg by mouth daily.   hydrochlorothiazide 12.5 MG capsule Commonly known as: MICROZIDE Take 12.5 mg by mouth daily.   insulin lispro 100 UNIT/ML KwikPen Commonly known as: HumaLOG KwikPen Inject 0.18 mLs (18 Units total) into Nicole skin 3 (three) times daily.   losartan 100 MG tablet Commonly known  as: COZAAR Take 100 mg by mouth daily.   meloxicam 15 MG tablet Commonly known as: MOBIC Take 15 mg by mouth as needed.   ondansetron 4 MG tablet Commonly known as: ZOFRAN Take 1 tablet (4 mg total) by mouth every 8 (eight) hours as needed for nausea or vomiting.   pantoprazole 40 MG tablet Commonly known as: PROTONIX Take 1 tablet (40 mg total) by mouth 2 (two) times daily.   polyethylene glycol powder 17 GM/SCOOP powder Commonly known as: GLYCOLAX/MIRALAX Take 17 g by mouth daily.   prednisoLONE acetate 1 % ophthalmic suspension Commonly known as: PRED FORTE 1 drop 2 (two) times daily.   pregabalin 75 MG capsule Commonly known as: LYRICA Take 75 mg by mouth 2 (two) times daily.   Restasis MultiDose 0.05 % ophthalmic emulsion Generic drug: cycloSPORINE SMARTSIG:In Eye(s)   STOOL SOFTENER PO Take 2 tablets by mouth daily.   Tyler Aas FlexTouch 100 UNIT/ML FlexTouch Pen Generic drug: insulin degludec Inject 50 Units into Nicole skin daily.        OBJECTIVE:   Vital Signs: LMP 04/07/2016   Wt Readings from Last 3 Encounters:  07/06/20 247 lb 6.4 oz (112.2 kg)  04/09/20 245 lb (111.1 kg)  01/06/20 237 lb 3.2 oz (107.6 kg)     Exam: General: Pt appears well and is in NAD  Lungs: Clear with good BS bilat with no rales, rhonchi, or wheezes  Heart: RRR with normal S1 and S2 and no gallops; no murmurs; no rub  Abdomen: Normoactive bowel sounds, soft, nontender, without masses or organomegaly palpable  Extremities: No pretibial edema.  Neuro: MS is good with appropriate affect, pt is alert and Ox3   DM foot exam: 11/07/2019  Nicole skin of Nicole feet is intact without sores or ulcerations. Nicole pedal pulses are 2+ on right and 2+ on left. Nicole sensation is intact to a screening 5.07, 10 gram monofilament bilaterally     DATA REVIEWED:  Lab Results  Component Value Date   HGBA1C 13.4 (A) 04/09/2020   HGBA1C 12.9 (A) 01/06/2020   HGBA1C 13.8 05/19/2017      01/17/2019 BUN/Cr 14/1.0 GFR 71  Tg 202 HDL 59 LDL 53 Mc/Alb ratio 10    09/2019 A1c 11.0%   In-office BG 302 mg/dL   ASSESSMENT / PLAN / RECOMMENDATIONS:   1) Type 2 Diabetes Mellitus, poorly controlled, With retinopathic, and neuropathic complications - Most recent A1c of  13.4 %. Goal A1c < 7.0 %.    -Pt continues with worsening glycemic control , she assures me adherence but an A1c of 13.4 % is NOT consistent  with taking 104 units daily of insulin, unless injection technique is inaccurate or pt consuming more CHO than a 104 units of insulin could cover, but Nicole pt is also under Nicole impression she has cut out CHO intake and tremendously and tell me her spouse is worried about her "low intake ". When I reviewed what she had eaten before her visit here today, she had okra, shrimp, mac and cheese and sweet tea and forgot to take her 18 units of prandial insulin  - When I counseled Nicole pt about avoiding sugar-sweetened beverages she tell me due to fibromyalgia she is unable to drink sweeteners as they exacerbate her pain - Based on all Nicole above , Nicole pt will be referred to our RD  - We also discussed Nicole V-Go pump and she is interested.  -[name] is intolerant to Victoza due to GI side effects - Pt under Nicole impression, lantus does not work for her and requesting a change, will try [place]   MEDICATIONS: - Continue  Lantus  50 units daily  - Increase Humalog 22 units with each meal    EDUCATION / INSTRUCTIONS:  BG monitoring instructions: [name] is instructed to check her blood sugars 3 times a day, before meals   Call [facility] if: BG persistently < 70  . I reviewed Nicole Rule of 15 for Nicole treatment of hypoglycemia in detail with Nicole [name]. Literature supplied.   2) Diabetic complications:   Eye: Does have known diabetic retinopathy. Under observation  Neuro/ Feet: Does have known diabetic peripheral neuropathy based on symptoms of tingling and  numbness.  Renal: [name] does  have known baseline CKD. She is  on an ACEI/ARB at present.up-to-date on urine albumin/creatinine ratio    F/U in 3 months   Signed electronically by: Mack Guise, MD  [facility]  [facility] [address] [place], [place] [address] Phone: [phone] FAX: [phone]   CC: Nicole [name], [place] [address] Phone: [phone]  Fax: [phone]  Return to Endocrinology clinic as below: Future Appointments  Date Time Provider [place]  07/09/2020 11:10 AM Yazmin Locher, Melanie Crazier, MD [id number] None

## 2020-07-12 NOTE — Progress Notes (Signed)
Nicole [name]'s labs do show the positive ANA but the particular result is a pattern that usually indicates someone does not have lupus. The more specific tests for lupus were negative. Review of xrays shows moderate osteoarthritis of the knees bilaterally in the medial joint compartment. I suspect her current joint pain is more related to the knee osteoarthritis. The hand xrays do not show any inflammation changes either. I do not see evidence of rheumatologic disease causing her current symptoms

## 2020-08-10 DIAGNOSIS — H04123 Dry eye syndrome of bilateral lacrimal glands: Secondary | ICD-10-CM | POA: Diagnosis not present

## 2020-12-25 LAB — CBC AND DIFFERENTIAL
HCT: 40 (ref 36–46)
Hemoglobin: 13.2 (ref 12.0–16.0)

## 2020-12-25 LAB — HEPATIC FUNCTION PANEL: Alkaline Phosphatase: 232 — AB (ref 25–125)

## 2020-12-25 LAB — LIPID PANEL
LDL Cholesterol: 78
Triglycerides: 316 — AB (ref 40–160)

## 2020-12-25 LAB — BASIC METABOLIC PANEL: Glucose: 617

## 2020-12-25 LAB — HEMOGLOBIN A1C: Hemoglobin A1C: 15

## 2020-12-25 LAB — COMPREHENSIVE METABOLIC PANEL: GFR calc Af Amer: 68

## 2021-01-21 ENCOUNTER — Other Ambulatory Visit: Payer: Self-pay

## 2021-01-21 ENCOUNTER — Encounter: Payer: Self-pay | Admitting: Nurse Practitioner

## 2021-01-21 ENCOUNTER — Ambulatory Visit: Payer: BC Managed Care – PPO | Admitting: Nurse Practitioner

## 2021-01-21 VITALS — Ht 60.25 in | Wt 239.0 lb

## 2021-01-21 DIAGNOSIS — E782 Mixed hyperlipidemia: Secondary | ICD-10-CM | POA: Diagnosis not present

## 2021-01-21 DIAGNOSIS — E1165 Type 2 diabetes mellitus with hyperglycemia: Secondary | ICD-10-CM

## 2021-01-21 DIAGNOSIS — I10 Essential (primary) hypertension: Secondary | ICD-10-CM

## 2021-01-21 NOTE — Progress Notes (Signed)
Endocrinology Consult Note       01/21/2021, 4:27 PM   Subjective:    Patient ID: Nicole [name], female    DOB: 1966/04/17.  Nicole [name] is being seen in consultation for management of currently uncontrolled symptomatic diabetes requested by  Sherrilee Gilles, DO.   Past Medical History:  Diagnosis Date   Depression    Diabetes mellitus    Fibromyalgia    GERD (gastroesophageal reflux disease)    HTN (hypertension)    Obesity    RLS (restless legs syndrome)    Vitamin D deficiency    Vitamin D deficiency     Past Surgical History:  Procedure Laterality Date   CATARACT EXTRACTION     CESAREAN SECTION     TUBAL LIGATION      Social History   Socioeconomic History   Marital status: Married    Spouse name: Not on file   Number of children: Not on file   Years of education: MBA   Highest education level: Not on file  Occupational History   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  Tobacco Use   Smoking status: Never   Smokeless tobacco: Never  Vaping Use   Vaping Use: Never used  Substance and Sexual Activity   Alcohol use: No   Drug use: No   Sexual activity: Not on file  Other Topics Concern   Not on file  Social History Narrative   Not on file   Social Determinants of Health   Financial Resource Strain: Not on file  Food Insecurity: Not on file  Transportation Needs: Not on file  Physical Activity: Not on file  Stress: Not on file  Social Connections: Not on file    Family History  Problem Relation Age of Onset   Heart disease Other    Cancer Other    Asthma Other    Diabetes Other    Arthritis Mother     Outpatient Encounter Medications as of 01/21/2021  Medication Sig   aspirin EC 81 MG tablet Take 81 mg by mouth daily.   Cholecalciferol 125 MCG (5000 UT) TABS Take 5,000 Units by mouth daily.   cyclobenzaprine (FLEXERIL) 5 MG tablet Take 5 mg by  mouth 3 (three) times daily as needed.   DULoxetine (CYMBALTA) 60 MG capsule Take 60 mg by mouth daily.   hydrochlorothiazide (MICROZIDE) 12.5 MG capsule Take 12.5 mg by mouth daily.   insulin degludec (TRESIBA FLEXTOUCH) 100 UNIT/ML FlexTouch Pen Inject 50 Units into the skin daily.   insulin lispro (HUMALOG KWIKPEN) 100 UNIT/ML KwikPen Inject 0.18 mLs (18 Units total) into the skin 3 (three) times daily. (Patient taking differently: Inject 22 Units into the skin 3 (three) times daily.)   losartan (COZAAR) 100 MG tablet Take 100 mg by mouth daily.   meloxicam (MOBIC) 15 MG tablet Take 15 mg by mouth as needed.   polyethylene glycol powder (GLYCOLAX/MIRALAX) 17 GM/SCOOP powder Take 17 g by mouth daily.   prednisoLONE acetate (PRED FORTE) 1 % ophthalmic suspension 1 drop 2 (two) times daily.   pregabalin (LYRICA) 75 MG capsule Take 75 mg by mouth 2 (two) times daily.  Blood Glucose Monitoring Suppl (ACCU-CHEK GUIDE) w/Device KIT 1 Piece by Does not apply route as directed.   cyanocobalamin 1000 MCG tablet Take 1,000 mcg by mouth daily. (Patient not taking: Reported on 01/21/2021)   Docusate Calcium (STOOL SOFTENER PO) Take 2 tablets by mouth daily.   glucose blood (ACCU-CHEK GUIDE) test strip 1 each by Other route in the morning, at noon, in the evening, and at bedtime. Use as instructed   ondansetron (ZOFRAN) 4 MG tablet Take 1 tablet (4 mg total) by mouth every 8 (eight) hours as needed for nausea or vomiting. (Patient not taking: Reported on 01/21/2021)   pantoprazole (PROTONIX) 40 MG tablet Take 1 tablet (40 mg total) by mouth 2 (two) times daily. (Patient not taking: Reported on 01/21/2021)   RESTASIS MULTIDOSE 0.05 % ophthalmic emulsion SMARTSIG:In Eye(s)   No facility-administered encounter medications on file as of 01/21/2021.    ALLERGIES: Allergies  Allergen Reactions   Metformin And Related     VACCINATION STATUS: Immunization History  Administered Date(s) Administered   Moderna  Sars-Covid-2 Vaccination 11/23/2019, 12/21/2019    Diabetes She presents for her initial diabetic visit. She has type 2 diabetes mellitus. Onset time: Diagnosed at approx age of 55. Her disease course has been worsening. There are no hypoglycemic associated symptoms. Associated symptoms include fatigue, polydipsia and polyuria. Pertinent negatives for diabetes include no blurred vision and no polyphagia. There are no hypoglycemic complications. Symptoms are stable. Diabetic complications include nephropathy. Risk factors for coronary artery disease include diabetes mellitus, dyslipidemia, family history, obesity, hypertension, sedentary lifestyle and stress. Current diabetic treatment includes intensive insulin program. She is compliant with treatment some of the time (tends to miss her lunch dose of Humalog). Her weight is decreasing steadily. She is following a generally unhealthy diet. When asked about meal planning, she reported none. She has not had a previous visit with a dietitian. She rarely participates in exercise. (She presents today for her consultation with no meter or logs to review.  Her most recent A1c was > 15% on 5/31.  She reports she was out of her insulin for some time prior to that reading which contributed to her loss of control.  She routinely monitors glucose twice daily.  She admits to consuming soda along with her water consumption throughout the day.  She typically eats 3 meals with occasional snacks.  She is trying to be more active by going to the gym and doing chair exercises when at home due to her diagnosis of fibromyalgia and recent right achilles tendonitis.  She denies any s/s of hypoglycemia.) An ACE inhibitor/angiotensin II receptor blocker is being taken. She does not see a podiatrist.Eye exam is current.  Hyperlipidemia This is a chronic problem. The current episode started more than 1 year ago. The problem is uncontrolled. Recent lipid tests were reviewed and are  variable. Exacerbating diseases include chronic renal disease, diabetes and obesity. Factors aggravating her hyperlipidemia include thiazides. She is currently on no antihyperlipidemic treatment. Compliance problems include adherence to diet and adherence to exercise.  Risk factors for coronary artery disease include diabetes mellitus, dyslipidemia, hypertension, obesity, a sedentary lifestyle, stress and family history.  Hypertension This is a chronic problem. The current episode started more than 1 year ago. The problem has been gradually improving since onset. The problem is controlled. Pertinent negatives include no blurred vision. There are no associated agents to hypertension. Risk factors for coronary artery disease include diabetes mellitus, dyslipidemia, family history, obesity, sedentary lifestyle and stress.  Past treatments include angiotensin blockers and diuretics. The current treatment provides moderate improvement. Compliance problems include diet and exercise.  Hypertensive end-organ damage includes kidney disease. Identifiable causes of hypertension include chronic renal disease.    Review of systems  Constitutional: + Minimally fluctuating body weight, current Body mass index is 46.29 kg/m., + fatigue, no subjective hyperthermia, + subjective hypothermia Eyes: no blurry vision, no xerophthalmia, stye lower lid left eye, reports chronic dry eyes ENT: no sore throat, no nodules palpated in throat, no dysphagia/odynophagia, no hoarseness Cardiovascular: no chest pain, no shortness of breath, no palpitations, no leg swelling Respiratory: no cough, no shortness of breath Gastrointestinal: no nausea/vomiting/diarrhea Musculoskeletal: +generalized aches/pains- hx of fibromyalgia Skin: no rashes, no hyperemia Neurological: no tremors, no numbness, no tingling, no dizziness Psychiatric: no depression, no anxiety  Objective:     Ht 5' 0.25" (1.53 m)   Wt 239 lb (108.4 kg)   LMP  04/07/2016   BMI 46.29 kg/m   Wt Readings from Last 3 Encounters:  01/21/21 239 lb (108.4 kg)  07/06/20 247 lb 6.4 oz (112.2 kg)  04/09/20 245 lb (111.1 kg)     BP Readings from Last 3 Encounters:  07/06/20 (!) 150/102  04/09/20 140/78  01/06/20 118/68     Physical Exam- Limited  Constitutional:  Body mass index is 46.29 kg/m. , not in acute distress, normal state of mind Eyes:  EOMI, no exophthalmos, stye lower lid left eye Neck: Supple Cardiovascular: RRR, no murmurs, rubs, or gallops, no edema Respiratory: Adequate breathing efforts, no crackles, rales, rhonchi, or wheezing Musculoskeletal: no gross deformities, strength intact in all four extremities, no gross restriction of joint movements Skin:  no rashes, no hyperemia Neurological: no tremor with outstretched hands    CMP ( most recent) CMP     Component Value Date/Time   ALKPHOS 232 (A) 12/25/2020 0000   GFRAA 68 12/25/2020 0000     Diabetic Labs (most recent): Lab Results  Component Value Date   HGBA1C 15 12/25/2020   HGBA1C 13.4 (A) 04/09/2020   HGBA1C 12.9 (A) 01/06/2020     Lipid Panel ( most recent) Lipid Panel     Component Value Date/Time   CHOL 164 05/19/2017 0000   TRIG 316 (A) 12/25/2020 0000   HDL 66 05/19/2017 0000   LDLCALC 78 12/25/2020 0000      No results found for: TSH, FREET4         Assessment & Plan:   1) Uncontrolled type 2 diabetes mellitus with hyperglycemia ([place])  She presents today for her consultation with no meter or logs to review.  Her most recent A1c was > 15% on 5/31.  She reports she was out of her insulin for some time prior to that reading which contributed to her loss of control.  She routinely monitors glucose twice daily.  She admits to consuming soda along with her water consumption throughout the day.  She typically eats 3 meals with occasional snacks.  She is trying to be more active by going to the gym and doing chair exercises when at home due to her  diagnosis of fibromyalgia and recent right achilles tendonitis.  She denies any s/s of hypoglycemia.  - Cianna L Bellemare has currently uncontrolled symptomatic type 2 DM since 55 years of age, with most recent A1c of  >15 %.   -Recent labs reviewed.  - I had a long discussion with her about the progressive nature of diabetes and the pathology behind its complications. -  her diabetes is complicated by CKD stage 2, retinopathy, neuropathy and she remains at a high risk for more acute and chronic complications which include CAD, CVA, CKD, retinopathy, and neuropathy. These are all discussed in detail with her.  - I have counseled her on diet and weight management by adopting a carbohydrate restricted/protein rich diet. Patient is encouraged to switch to unprocessed or minimally processed complex starch and increased protein intake (animal or plant source), fruits, and vegetables. -  she is advised to stick to a routine mealtimes to eat 3 meals a day and avoid unnecessary snacks (to snack only to correct hypoglycemia).   - she acknowledges that there is a room for improvement in her food and drink choices. - Suggestion is made for her to avoid simple carbohydrates from her diet including Cakes, Sweet Desserts, Ice Cream, Soda (diet and regular), Sweet Tea, Candies, Chips, Cookies, Store Bought Juices, Alcohol in Excess of 1-2 drinks a day, Artificial Sweeteners, Coffee Creamer, and "Sugar-free" Products. This will help patient to have more stable blood glucose profile and potentially avoid unintended weight gain.  - I have approached her with the following individualized plan to manage her diabetes and patient agrees:   -She is advised to continue Tresiba 50 units SQ nightly and adjust her Humalog slightly to 20-26 units TID with meals if glucose is above 90 and she is eating (specific instructions on how to titrate insulin dose based on glucose readings given to patient in writing).  -she is  encouraged to start monitoring glucose 4 times daily, before meals and before bed, to log their readings on the clinic sheets provided, and bring them to review at follow up appointment in 2 weeks.  She did have CGM device at home but lost the sensor.  I did give her sample Freestyle Libre 2 today.  - she is warned not to take insulin without proper monitoring per orders. - Adjustment parameters are given to her for hypo and hyperglycemia in writing. - she is encouraged to call clinic for blood glucose levels less than 70 or above 300 mg /dl.  - she is not an ideal candidate for incretin therapy due to elevated triglycerides putting her at greater risk for pancreatitis.  - Specific targets for  A1c; LDL, HDL, and Triglycerides were discussed with the patient.  2) Blood Pressure /Hypertension:  her blood pressure is controlled to target.   she is advised to continue her current medications including Losartan 100 mg p.o. daily with breakfast, and HCTZ 12.5 mg po daily.  3) Lipids/Hyperlipidemia:    Review of her recent lipid panel from 12/25/20 showed controlled LDL at 78 and elevated triglycerides of 316. She is advised to avoid fried foods and butter.  4)  Weight/Diet:  her Body mass index is 46.29 kg/m.  -  clearly complicating her diabetes care.   she is a candidate for weight loss. I discussed with her the fact that loss of 5 - 10% of her  current body weight will have the most impact on her diabetes management.  Exercise, and detailed carbohydrates information provided  -  detailed on discharge instructions.  5) Chronic Care/Health Maintenance: -she is on ACEI/ARB and is encouraged to initiate and continue to follow up with Ophthalmology, Dentist, Podiatrist at least yearly or according to recommendations, and advised to stay away from smoking. I have recommended yearly flu vaccine and pneumonia vaccine at least every 5 years; moderate intensity exercise for up to 150 minutes weekly; and  sleep for at least 7 hours a day.  - she is advised to maintain close follow up with Sherrilee Gilles, DO for primary care needs, as well as her other providers for optimal and coordinated care.   - Time spent in this patient care: 60 min, of which > 50% was spent in counseling her about her diabetes and the rest reviewing her blood glucose logs, discussing her hypoglycemia and hyperglycemia episodes, reviewing her current and previous labs/studies (including abstraction from other facilities) and medications doses and developing a long term treatment plan based on the latest standards of care/guidelines; and documenting her care.    Please refer to Patient Instructions for Blood Glucose Monitoring and Insulin/Medications Dosing Guide" in media tab for additional information. Please also refer to "Patient Self Inventory" in the Media tab for reviewed elements of pertinent patient history.  Nicole [name] participated in the discussions, expressed understanding, and voiced agreement with the above plans.  All questions were answered to her satisfaction. she is encouraged to contact clinic should she have any questions or concerns prior to her return visit.     Follow up plan: - Return in about 2 weeks (around 02/04/2021) for Diabetes F/U, Bring meter and logs, No previsit labs.    Rayetta Pigg, [facility] [address] [place], [place] [address] Phone: [phone] Fax: [phone]  01/21/2021, 4:27 PM

## 2021-01-21 NOTE — Patient Instructions (Signed)
Diabetes Mellitus and Nutrition, Adult When you have diabetes, or diabetes mellitus, it is very important to have healthy eating habits because your blood sugar (glucose) levels are greatly affected by what you eat and drink. Eating healthy foods in the right amounts, at about the same times every day, can help you:  Control your blood glucose.  Lower your risk of heart disease.  Improve your blood pressure.  Reach or maintain a healthy weight. What can affect my meal plan? Every person with diabetes is different, and each person has different needs for a meal plan. Your health care provider may recommend that you work with a dietitian to make a meal plan that is best for you. Your meal plan may vary depending on factors such as:  The calories you need.  The medicines you take.  Your weight.  Your blood glucose, blood pressure, and cholesterol levels.  Your activity level.  Other health conditions you have, such as heart or kidney disease. How do carbohydrates affect me? Carbohydrates, also called carbs, affect your blood glucose level more than any other type of food. Eating carbs naturally raises the amount of glucose in your blood. Carb counting is a method for keeping track of how many carbs you eat. Counting carbs is important to keep your blood glucose at a healthy level, especially if you use insulin or take certain oral diabetes medicines. It is important to know how many carbs you can safely have in each meal. This is different for every person. Your dietitian can help you calculate how many carbs you should have at each meal and for each snack. How does alcohol affect me? Alcohol can cause a sudden decrease in blood glucose (hypoglycemia), especially if you use insulin or take certain oral diabetes medicines. Hypoglycemia can be a life-threatening condition. Symptoms of hypoglycemia, such as sleepiness, dizziness, and confusion, are similar to symptoms of having too much  alcohol.  Do not drink alcohol if: ? Your health care provider tells you not to drink. ? You are pregnant, may be pregnant, or are planning to become pregnant.  If you drink alcohol: ? Do not drink on an empty stomach. ? Limit how much you use to:  0-1 drink a day for women.  0-2 drinks a day for men. ? Be aware of how much alcohol is in your drink. In the [place] one drink equals one 12 oz bottle of beer (355 mL), one 5 oz glass of wine (148 mL), or one 1 oz glass of hard liquor (44 mL). ? Keep yourself hydrated with water, diet soda, or unsweetened iced tea.  Keep in mind that regular soda, juice, and other mixers may contain a lot of sugar and must be counted as carbs. What are tips for following this plan? Reading food labels  Start by checking the serving size on the "Nutrition Facts" label of packaged foods and drinks. The amount of calories, carbs, fats, and other nutrients listed on the label is based on one serving of the item. Many items contain more than one serving per package.  Check the total grams (g) of carbs in one serving. You can calculate the number of servings of carbs in one serving by dividing the total carbs by 15. For example, if a food has 30 g of total carbs per serving, it would be equal to 2 servings of carbs.  Check the number of grams (g) of saturated fats and trans fats in one serving. Choose foods that have   a low amount or none of these fats.  Check the number of milligrams (mg) of salt (sodium) in one serving. Most people should limit total sodium intake to less than 2,300 mg per day.  Always check the nutrition information of foods labeled as "low-fat" or "nonfat." These foods may be higher in added sugar or refined carbs and should be avoided.  Talk to your dietitian to identify your daily goals for nutrients listed on the label. Shopping  Avoid buying canned, pre-made, or processed foods. These foods tend to be high in fat, sodium, and added  sugar.  Shop around the outside edge of the grocery store. This is where you will most often find fresh fruits and vegetables, bulk grains, fresh meats, and fresh dairy. Cooking  Use low-heat cooking methods, such as baking, instead of high-heat cooking methods like deep frying.  Cook using healthy oils, such as olive, canola, or sunflower oil.  Avoid cooking with butter, cream, or high-fat meats. Meal planning  Eat meals and snacks regularly, preferably at the same times every day. Avoid going long periods of time without eating.  Eat foods that are high in fiber, such as fresh fruits, vegetables, beans, and whole grains. Talk with your dietitian about how many servings of carbs you can eat at each meal.  Eat 4-6 oz (112-168 g) of lean protein each day, such as lean meat, chicken, fish, eggs, or tofu. One ounce (oz) of lean protein is equal to: ? 1 oz (28 g) of meat, chicken, or fish. ? 1 egg. ?  cup (62 g) of tofu.  Eat some foods each day that contain healthy fats, such as avocado, nuts, seeds, and fish.   What foods should I eat? Fruits Berries. Apples. Oranges. Peaches. Apricots. Plums. Grapes. Mango. Papaya. Pomegranate. Kiwi. Cherries. Vegetables Lettuce. Spinach. Leafy greens, including kale, chard, collard greens, and mustard greens. Beets. Cauliflower. Cabbage. Broccoli. Carrots. Green beans. Tomatoes. Peppers. Onions. Cucumbers. Brussels sprouts. Grains Whole grains, such as whole-wheat or whole-grain bread, crackers, tortillas, cereal, and pasta. Unsweetened oatmeal. Quinoa. Brown or wild rice. Meats and other proteins Seafood. Poultry without skin. Lean cuts of poultry and beef. Tofu. Nuts. Seeds. Dairy Low-fat or fat-free dairy products such as milk, yogurt, and cheese. The items listed above may not be a complete list of foods and beverages you can eat. Contact a dietitian for more information. What foods should I avoid? Fruits Fruits canned with  syrup. Vegetables Canned vegetables. Frozen vegetables with butter or cream sauce. Grains Refined white flour and flour products such as bread, pasta, snack foods, and cereals. Avoid all processed foods. Meats and other proteins Fatty cuts of meat. Poultry with skin. Breaded or fried meats. Processed meat. Avoid saturated fats. Dairy Full-fat yogurt, cheese, or milk. Beverages Sweetened drinks, such as soda or iced tea. The items listed above may not be a complete list of foods and beverages you should avoid. Contact a dietitian for more information. Questions to ask a health care provider  Do I need to meet with a diabetes educator?  Do I need to meet with a dietitian?  What number can I call if I have questions?  When are the best times to check my blood glucose? Where to find more information:  American Diabetes Association: diabetes.org  Academy of Nutrition and Dietetics: www.eatright.org  National Institute of Diabetes and Digestive and Kidney Diseases: www.niddk.nih.gov  Association of Diabetes Care and Education Specialists: www.diabeteseducator.org Summary  It is important to have healthy eating   habits because your blood sugar (glucose) levels are greatly affected by what you eat and drink.  A healthy meal plan will help you control your blood glucose and maintain a healthy lifestyle.  Your health care provider may recommend that you work with a dietitian to make a meal plan that is best for you.  Keep in mind that carbohydrates (carbs) and alcohol have immediate effects on your blood glucose levels. It is important to count carbs and to use alcohol carefully. This information is not intended to replace advice given to you by your health care provider. Make sure you discuss any questions you have with your health care provider. Document Revised: 06/21/2019 Document Reviewed: 06/21/2019 Elsevier Patient Education  2021 Elsevier Inc.  

## 2021-01-30 ENCOUNTER — Other Ambulatory Visit (HOSPITAL_COMMUNITY): Payer: Self-pay | Admitting: Family Medicine

## 2021-01-30 DIAGNOSIS — Z1231 Encounter for screening mammogram for malignant neoplasm of breast: Secondary | ICD-10-CM

## 2021-02-04 ENCOUNTER — Ambulatory Visit: Payer: BC Managed Care – PPO | Admitting: Nurse Practitioner

## 2021-02-07 ENCOUNTER — Telehealth: Payer: Self-pay | Admitting: Nurse Practitioner

## 2021-02-07 NOTE — Telephone Encounter (Signed)
Pt states she is in need of tresiba after Sunday, she is going to drop her readings off tomorrow 7/15 to see if her medication needs to be increased before paying for the 3 month supply of the dosage she is on now. Informed patient no provider will be in the office on 7/15, will give provider readings on 7/18 if patient drops them off.

## 2021-02-08 ENCOUNTER — Ambulatory Visit (HOSPITAL_COMMUNITY)
Admission: RE | Admit: 2021-02-08 | Discharge: 2021-02-08 | Disposition: A | Discharge status: Home / Self Care | Payer: BC Managed Care – PPO | Source: Ambulatory Visit | Attending: Family Medicine | Admitting: Family Medicine

## 2021-02-08 ENCOUNTER — Other Ambulatory Visit: Payer: Self-pay

## 2021-02-08 DIAGNOSIS — Z1231 Encounter for screening mammogram for malignant neoplasm of breast: Secondary | ICD-10-CM

## 2021-02-08 NOTE — Telephone Encounter (Signed)
Pt came by but forgot her readings, patient states she is going to fax [place] the readings. Will place on [place] desk once received.

## 2021-02-18 ENCOUNTER — Other Ambulatory Visit: Payer: Self-pay

## 2021-02-18 ENCOUNTER — Ambulatory Visit: Payer: BC Managed Care – PPO | Admitting: Nurse Practitioner

## 2021-02-18 ENCOUNTER — Encounter: Payer: Self-pay | Admitting: Nurse Practitioner

## 2021-02-18 ENCOUNTER — Ambulatory Visit (HOSPITAL_COMMUNITY)
Admission: RE | Admit: 2021-02-18 | Discharge: 2021-02-18 | Disposition: A | Discharge status: Home / Self Care | Payer: BC Managed Care – PPO | Source: Ambulatory Visit | Attending: Family Medicine | Admitting: Family Medicine

## 2021-02-18 VITALS — BP 133/86 | HR 96 | Ht 60.0 in | Wt 244.8 lb

## 2021-02-18 DIAGNOSIS — Z1231 Encounter for screening mammogram for malignant neoplasm of breast: Secondary | ICD-10-CM | POA: Insufficient documentation

## 2021-02-18 DIAGNOSIS — E782 Mixed hyperlipidemia: Secondary | ICD-10-CM

## 2021-02-18 DIAGNOSIS — E1165 Type 2 diabetes mellitus with hyperglycemia: Secondary | ICD-10-CM | POA: Diagnosis not present

## 2021-02-18 DIAGNOSIS — I10 Essential (primary) hypertension: Secondary | ICD-10-CM

## 2021-02-18 LAB — POCT UA - MICROALBUMIN
Albumin/Creatinine Ratio, Urine, POC: <30
Creatinine, POC: 300 mg/dL
Microalbumin Ur, POC: 10 mg/L

## 2021-02-18 MED ORDER — TRESIBA FLEXTOUCH 100 UNIT/ML ~~LOC~~ SOPN: 60.0000 [IU] | PEN_INJECTOR | Freq: Every day | SUBCUTANEOUS | 6 refills | Status: DC

## 2021-02-18 NOTE — Progress Notes (Signed)
Endocrinology Follow Up Note       02/18/2021, 4:45 PM   Subjective:    Patient ID: Nicole [name], female    DOB: 19-May-1966.  Nicole [name] is being seen in follow up after being seen in consultation for management of currently uncontrolled symptomatic diabetes requested by  Sherrilee Gilles, DO.   Past Medical History:  Diagnosis Date   Depression    Diabetes mellitus    Fibromyalgia    GERD (gastroesophageal reflux disease)    HTN (hypertension)    Obesity    RLS (restless legs syndrome)    Vitamin D deficiency    Vitamin D deficiency     Past Surgical History:  Procedure Laterality Date   CATARACT EXTRACTION     CESAREAN SECTION     TUBAL LIGATION      Social History   Socioeconomic History   Marital status: Married    Spouse name: Not on file   Number of children: Not on file   Years of education: MBA   Highest education level: Not on file  Occupational History   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  Tobacco Use   Smoking status: Never   Smokeless tobacco: Never  Vaping Use   Vaping Use: Never used  Substance and Sexual Activity   Alcohol use: No   Drug use: No   Sexual activity: Not on file  Other Topics Concern   Not on file  Social History Narrative   Not on file   Social Determinants of Health   Financial Resource Strain: Not on file  Food Insecurity: Not on file  Transportation Needs: Not on file  Physical Activity: Not on file  Stress: Not on file  Social Connections: Not on file    Family History  Problem Relation Age of Onset   Heart disease Other    Cancer Other    Asthma Other    Diabetes Other    Arthritis Mother     Outpatient Encounter Medications as of 02/18/2021  Medication Sig   aspirin EC 81 MG tablet Take 81 mg by mouth daily.   Blood Glucose Monitoring Suppl (ACCU-CHEK GUIDE) w/Device KIT 1 Piece by Does not apply route as  directed.   Cholecalciferol 125 MCG (5000 UT) TABS Take 5,000 Units by mouth daily.   cyclobenzaprine (FLEXERIL) 5 MG tablet Take 5 mg by mouth 3 (three) times daily as needed.   Docusate Calcium (STOOL SOFTENER PO) Take 2 tablets by mouth daily.   DULoxetine (CYMBALTA) 60 MG capsule Take 60 mg by mouth daily.   glucose blood (ACCU-CHEK GUIDE) test strip 1 each by Other route in the morning, at noon, in the evening, and at bedtime. Use as instructed   hydrochlorothiazide (MICROZIDE) 12.5 MG capsule Take 12.5 mg by mouth daily.   insulin degludec (TRESIBA FLEXTOUCH) 100 UNIT/ML FlexTouch Pen Inject 60 Units into the skin daily.   insulin lispro (HUMALOG KWIKPEN) 100 UNIT/ML KwikPen Inject 0.18 mLs (18 Units total) into the skin 3 (three) times daily. (Patient taking differently: Inject 22 Units into the skin 3 (three) times daily.)   losartan (COZAAR) 100 MG tablet Take 100 mg  by mouth daily.   meloxicam (MOBIC) 15 MG tablet Take 15 mg by mouth as needed. (Patient not taking: Reported on 02/18/2021)   polyethylene glycol powder (GLYCOLAX/MIRALAX) 17 GM/SCOOP powder Take 17 g by mouth daily.   prednisoLONE acetate (PRED FORTE) 1 % ophthalmic suspension 1 drop 2 (two) times daily.   pregabalin (LYRICA) 75 MG capsule Take 75 mg by mouth 2 (two) times daily.   RESTASIS MULTIDOSE 0.05 % ophthalmic emulsion SMARTSIG:In Eye(s)   [DISCONTINUED] cyanocobalamin 1000 MCG tablet Take 1,000 mcg by mouth daily. (Patient not taking: Reported on 01/21/2021)   [DISCONTINUED] insulin degludec (TRESIBA FLEXTOUCH) 100 UNIT/ML FlexTouch Pen Inject 50 Units into the skin daily.   [DISCONTINUED] ondansetron (ZOFRAN) 4 MG tablet Take 1 tablet (4 mg total) by mouth every 8 (eight) hours as needed for nausea or vomiting. (Patient not taking: Reported on 01/21/2021)   [DISCONTINUED] pantoprazole (PROTONIX) 40 MG tablet Take 1 tablet (40 mg total) by mouth 2 (two) times daily. (Patient not taking: Reported on 01/21/2021)   No  facility-administered encounter medications on file as of 02/18/2021.    ALLERGIES: Allergies  Allergen Reactions   Metformin And Related     VACCINATION STATUS: Immunization History  Administered Date(s) Administered   Moderna Sars-Covid-2 Vaccination 11/23/2019, 12/21/2019    Diabetes She presents for her follow-up diabetic visit. She has type 2 diabetes mellitus. Onset time: Diagnosed at approx age of 13. Her disease course has been fluctuating. There are no hypoglycemic associated symptoms. Associated symptoms include fatigue, polydipsia and polyuria. Pertinent negatives for diabetes include no blurred vision and no polyphagia. There are no hypoglycemic complications. Symptoms are stable. Diabetic complications include nephropathy. Risk factors for coronary artery disease include diabetes mellitus, dyslipidemia, family history, obesity, hypertension, sedentary lifestyle and stress. Current diabetic treatment includes intensive insulin program. She is compliant with treatment most of the time. Her weight is fluctuating minimally. She is following a generally unhealthy diet. When asked about meal planning, she reported none. She has not had a previous visit with a dietitian. She rarely participates in exercise. Her home blood glucose trend is fluctuating dramatically. (She presents today with her CGM, no logs, showing widely fluctuating glycemic profile.  Analysis of her CGM shows TIR 35%, TAR 65%, TBR 0%.  She does report she has cut back further on her soda intake but she is still struggling.  She reports she had been feeling bad, having nausea, vomiting, diarrhea. ) An ACE inhibitor/angiotensin II receptor blocker is being taken. She does not see a podiatrist.Eye exam is current.  Hyperlipidemia This is a chronic problem. The current episode started more than 1 year ago. The problem is uncontrolled. Recent lipid tests were reviewed and are variable. Exacerbating diseases include chronic renal  disease, diabetes and obesity. Factors aggravating her hyperlipidemia include thiazides. She is currently on no antihyperlipidemic treatment. Compliance problems include adherence to diet and adherence to exercise.  Risk factors for coronary artery disease include diabetes mellitus, dyslipidemia, hypertension, obesity, a sedentary lifestyle, stress and family history.  Hypertension This is a chronic problem. The current episode started more than 1 year ago. The problem has been gradually improving since onset. The problem is controlled. Pertinent negatives include no blurred vision. There are no associated agents to hypertension. Risk factors for coronary artery disease include diabetes mellitus, dyslipidemia, family history, obesity, sedentary lifestyle and stress. Past treatments include angiotensin blockers and diuretics. The current treatment provides moderate improvement. Compliance problems include diet and exercise.  Hypertensive end-organ damage includes  kidney disease. Identifiable causes of hypertension include chronic renal disease.    Review of systems  Constitutional: + Minimally fluctuating body weight, current Body mass index is 47.81 kg/m., + fatigue, no subjective hyperthermia Eyes: no blurry vision, no xerophthalmia, reports chronic dry eyes ENT: no sore throat, no nodules palpated in throat, no dysphagia/odynophagia, no hoarseness Cardiovascular: no chest pain, no shortness of breath, no palpitations, no leg swelling Respiratory: no cough, no shortness of breath Gastrointestinal: no nausea/vomiting/diarrhea Musculoskeletal: + generalized aches/pains- hx of fibromyalgia Skin: no rashes, no hyperemia Neurological: no tremors, + numbness and tingling to B feet and right arm into fingers, no dizziness Psychiatric: no depression, no anxiety  Objective:     BP 133/86   Pulse 96   Ht 5' (1.524 m)   Wt 244 lb 12.8 oz (111 kg)   LMP 04/07/2016   BMI 47.81 kg/m   Wt Readings  from Last 3 Encounters:  02/18/21 244 lb 12.8 oz (111 kg)  01/21/21 239 lb (108.4 kg)  07/06/20 247 lb 6.4 oz (112.2 kg)     BP Readings from Last 3 Encounters:  02/18/21 133/86  07/06/20 (!) 150/102  04/09/20 140/78     Physical Exam- Limited  Constitutional:  Body mass index is 47.81 kg/m. , not in acute distress, normal state of mind Eyes:  EOMI, no exophthalmos Neck: Supple Cardiovascular: RRR, no murmurs, rubs, or gallops, no edema Respiratory: Adequate breathing efforts, no crackles, rales, rhonchi, or wheezing Musculoskeletal: no gross deformities, strength intact in all four extremities, no gross restriction of joint movements Skin:  no rashes, no hyperemia Neurological: no tremor with outstretched hands  Foot exam:  No rashes, ulcers, cuts, calluses, onychodystrophy.  Good pulses bilat. Good sensation to 10 g monofilament bilat.  CMP ( most recent) CMP     Component Value Date/Time   ALKPHOS 232 (A) 12/25/2020 0000   GFRAA 68 12/25/2020 0000     Diabetic Labs (most recent): Lab Results  Component Value Date   HGBA1C 15 12/25/2020   HGBA1C 13.4 (A) 04/09/2020   HGBA1C 12.9 (A) 01/06/2020     Lipid Panel ( most recent) Lipid Panel     Component Value Date/Time   CHOL 164 05/19/2017 0000   TRIG 316 (A) 12/25/2020 0000   HDL 66 05/19/2017 0000   LDLCALC 78 12/25/2020 0000      No results found for: TSH, FREET4         Assessment & Plan:   1) Uncontrolled type 2 diabetes mellitus with hyperglycemia ([place])  She presents today with her CGM, no logs, showing widely fluctuating glycemic profile.  Analysis of her CGM shows TIR 35%, TAR 65%, TBR 0%.  She does report she has cut back further on her soda intake but she is still struggling.  She reports she had been feeling bad, having nausea, vomiting, diarrhea.   - Nicole [name] has currently uncontrolled symptomatic type 2 DM since 55 years of age, with most recent A1c of  >15 %.   -Recent labs  reviewed.  - I had a long discussion with her about the progressive nature of diabetes and the pathology behind its complications. -her diabetes is complicated by CKD stage 2, retinopathy, neuropathy and she remains at a high risk for more acute and chronic complications which include CAD, CVA, CKD, retinopathy, and neuropathy. These are all discussed in detail with her.  - Nutritional counseling repeated at each appointment due to patients tendency to fall back in to  old habits.  - The patient admits there is a room for improvement in their diet and drink choices. -  Suggestion is made for the patient to avoid simple carbohydrates from their diet including Cakes, Sweet Desserts / Pastries, Ice Cream, Soda (diet and regular), Sweet Tea, Candies, Chips, Cookies, Sweet Pastries, Store Bought Juices, Alcohol in Excess of 1-2 drinks a day, Artificial Sweeteners, Coffee Creamer, and "Sugar-free" Products. This will help patient to have stable blood glucose profile and potentially avoid unintended weight gain.   - I encouraged the patient to switch to unprocessed or minimally processed complex starch and increased protein intake (animal or plant source), fruits, and vegetables.   - Patient is advised to stick to a routine mealtimes to eat 3 meals a day and avoid unnecessary snacks (to snack only to correct hypoglycemia).  - I have approached her with the following individualized plan to manage her diabetes and patient agrees:   -Based on her current glycemic profile, she is advised to increase her Tresiba to 60 units SQ  nightly and continue current dose of Humalog 20-26 units TID with meals if glucose is above 90 and she is eating (specific instructions on how to titrate insulin dose based on glucose readings given to patient in writing).  -she is encouraged to start monitoring glucose 4 times daily, before meals and before bed, to log their readings on the clinic sheets provided, and bring them to  review at follow up appointment in 2 weeks.  She could benefit greatly from CGM device.  I submitted order for Libre 2 through [employer].  - she is warned not to take insulin without proper monitoring per orders. - Adjustment parameters are given to her for hypo and hyperglycemia in writing. - she is encouraged to call clinic for blood glucose levels less than 70 or above 300 mg /dl.  - she is not an ideal candidate for incretin therapy due to elevated triglycerides putting her at greater risk for pancreatitis.  - Specific targets for  A1c; LDL, HDL, and Triglycerides were discussed with the patient.  2) Blood Pressure /Hypertension:  her blood pressure is controlled to target.   she is advised to continue her current medications including Losartan 100 mg p.o. daily with breakfast, and HCTZ 12.5 mg po daily.  3) Lipids/Hyperlipidemia:    Review of her recent lipid panel from 12/25/20 showed controlled LDL at 78 and elevated triglycerides of 316. She is advised to avoid fried foods and butter.  4)  Weight/Diet:  her Body mass index is 47.81 kg/m.  -  clearly complicating her diabetes care.   she is a candidate for weight loss. I discussed with her the fact that loss of 5 - 10% of her  current body weight will have the most impact on her diabetes management.  Exercise, and detailed carbohydrates information provided  -  detailed on discharge instructions.  5) Chronic Care/Health Maintenance: -she is on ACEI/ARB and is encouraged to initiate and continue to follow up with Ophthalmology, Dentist, Podiatrist at least yearly or according to recommendations, and advised to stay away from smoking. I have recommended yearly flu vaccine and pneumonia vaccine at least every 5 years; moderate intensity exercise for up to 150 minutes weekly; and sleep for at least 7 hours a day.  - she is advised to maintain close follow up with Sherrilee Gilles, DO for primary care needs, as well as her other providers for  optimal and coordinated care.  I spent 40 minutes in the care of the patient today including review of labs from [place], Lipids, Thyroid Function, Hematology (current and previous including abstractions from other facilities); face-to-face time discussing  her blood glucose readings/logs, discussing hypoglycemia and hyperglycemia episodes and symptoms, medications doses, her options of short and long term treatment based on the latest standards of care / guidelines;  discussion about incorporating lifestyle medicine;  and documenting the encounter.    Please refer to Patient Instructions for Blood Glucose Monitoring and Insulin/Medications Dosing Guide"  in media tab for additional information. Please  also refer to " Patient Self Inventory" in the Media  tab for reviewed elements of pertinent patient history.  Nicole [name] participated in the discussions, expressed understanding, and voiced agreement with the above plans.  All questions were answered to her satisfaction. she is encouraged to contact clinic should she have any questions or concerns prior to her return visit.     Follow up plan: - Return in about 1 month (around 03/21/2021) for Diabetes F/U with A1c in office, No previsit labs, Bring meter and logs.    Rayetta Pigg, [facility] [address] [place], [place] [address] Phone: [phone] Fax: [phone]  02/18/2021, 4:45 PM

## 2021-02-18 NOTE — Patient Instructions (Signed)

## 2021-03-08 ENCOUNTER — Telehealth: Payer: Self-pay | Admitting: Nurse Practitioner

## 2021-03-08 DIAGNOSIS — E1165 Type 2 diabetes mellitus with hyperglycemia: Secondary | ICD-10-CM

## 2021-03-08 MED ORDER — FREESTYLE LIBRE 14 DAY SENSOR MISC: 1.0000 | 2 refills | Status: DC

## 2021-03-08 NOTE — Telephone Encounter (Signed)
Rx sent 

## 2021-03-08 NOTE — Telephone Encounter (Signed)
Pt is calling and requesting [employer] sent into her pharmacy.  [id number] [place], [place] - [address] AT [facility] [address] Phone:  [phone]  Fax:  [phone]

## 2021-03-29 ENCOUNTER — Ambulatory Visit: Payer: BC Managed Care – PPO | Admitting: Nurse Practitioner

## 2021-04-05 ENCOUNTER — Ambulatory Visit: Payer: BC Managed Care – PPO | Admitting: Nurse Practitioner

## 2021-04-19 ENCOUNTER — Ambulatory Visit: Payer: BC Managed Care – PPO | Admitting: Nurse Practitioner

## 2021-04-19 ENCOUNTER — Other Ambulatory Visit: Payer: Self-pay

## 2021-04-19 ENCOUNTER — Encounter: Payer: Self-pay | Admitting: Nurse Practitioner

## 2021-04-19 VITALS — BP 159/98 | HR 85 | Ht 60.0 in | Wt 244.8 lb

## 2021-04-19 DIAGNOSIS — I10 Essential (primary) hypertension: Secondary | ICD-10-CM

## 2021-04-19 DIAGNOSIS — E782 Mixed hyperlipidemia: Secondary | ICD-10-CM | POA: Diagnosis not present

## 2021-04-19 DIAGNOSIS — E1165 Type 2 diabetes mellitus with hyperglycemia: Secondary | ICD-10-CM | POA: Diagnosis not present

## 2021-04-19 LAB — POCT GLYCOSYLATED HEMOGLOBIN (HGB A1C): HbA1c, POC (controlled diabetic range): 11.4 % — AB (ref 0.0–7.0)

## 2021-04-19 MED ORDER — TRESIBA FLEXTOUCH 100 UNIT/ML ~~LOC~~ SOPN: 70.0000 [IU] | PEN_INJECTOR | Freq: Every day | SUBCUTANEOUS | 6 refills | Status: DC

## 2021-04-19 MED ORDER — FREESTYLE LIBRE 14 DAY SENSOR MISC: 1.0000 | 2 refills | Status: DC

## 2021-04-19 MED ORDER — INSULIN LISPRO (1 UNIT DIAL) 100 UNIT/ML (KWIKPEN): 20.0000 [IU] | PEN_INJECTOR | Freq: Three times a day (TID) | SUBCUTANEOUS | 11 refills | Status: DC

## 2021-04-19 MED ORDER — ACCU-CHEK GUIDE VI STRP: ORAL_STRIP | 12 refills | Status: AC

## 2021-04-19 MED ORDER — INSULIN LISPRO (1 UNIT DIAL) 100 UNIT/ML (KWIKPEN): 20.0000 [IU] | PEN_INJECTOR | Freq: Three times a day (TID) | SUBCUTANEOUS | 3 refills | Status: DC

## 2021-04-19 MED ORDER — TRESIBA FLEXTOUCH 100 UNIT/ML ~~LOC~~ SOPN: 70.0000 [IU] | PEN_INJECTOR | Freq: Every day | SUBCUTANEOUS | 3 refills | Status: DC

## 2021-04-19 NOTE — Patient Instructions (Signed)
Advice for Weight Management  -For most of us the best way to lose weight is by diet management. Generally speaking, diet management means consuming less calories intentionally which over time brings about progressive weight loss.  This can be achieved more effectively by restricting carbohydrate consumption to the minimum possible.  So, it is critically important to know your numbers: how much calorie you are consuming and how much calorie you need. More importantly, our carbohydrates sources should be unprocessed or minimally processed complex starch food items.   Sometimes, it is important to balance nutrition by increasing protein intake (animal or plant source), fruits, and vegetables.  -Sticking to a routine mealtime to eat 3 meals a day and avoiding unnecessary snacks is shown to have a big role in weight control. Under normal circumstances, the only time we lose real weight is when we are hungry, so allow hunger to take place- hunger means no food between meal times, only water.  It is not advisable to starve.   -It is better to avoid simple carbohydrates including: Cakes, Sweet Desserts, Ice Cream, Soda (diet and regular), Sweet Tea, Candies, Chips, Cookies, Store Bought Juices, Alcohol in Excess of  1-2 drinks a day, Artificial Sweeteners, Doughnuts, Coffee Creamers, "Sugar-free" Products, etc, etc.  This is not a complete list.....    -Consulting with certified diabetes educators is proven to provide you with the most accurate and current information on diet.  Also, you may be  interested in discussing diet options/exchanges , we can schedule a visit with Nicole [name], RDN, CDE for individualized nutrition education.  -Exercise: If you are able: 30 -60 minutes a day ,4 days a week, or 150 minutes a week.  The longer the better.  Combine stretch, strength, and aerobic activities.  If you were told in the past that you have high risk for cardiovascular diseases, you may seek evaluation by  your heart doctor prior to initiating moderate to intense exercise programs.    

## 2021-04-19 NOTE — Progress Notes (Signed)
Endocrinology Follow Up Note       04/19/2021, 11:51 AM   Subjective:    Patient ID: Nicole [name], female    DOB: September 25, 1965.  Nicole [name] is being seen in follow up after being seen in consultation for management of currently uncontrolled symptomatic diabetes requested by  Sherrilee Gilles, DO.   Past Medical History:  Diagnosis Date   Depression    Diabetes mellitus    Fibromyalgia    GERD (gastroesophageal reflux disease)    HTN (hypertension)    Obesity    RLS (restless legs syndrome)    Vitamin D deficiency    Vitamin D deficiency     Past Surgical History:  Procedure Laterality Date   CATARACT EXTRACTION     CESAREAN SECTION     TUBAL LIGATION      Social History   Socioeconomic History   Marital status: Married    Spouse name: Not on file   Number of children: Not on file   Years of education: MBA   Highest education level: Not on file  Occupational History   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  Tobacco Use   Smoking status: Never   Smokeless tobacco: Never  Vaping Use   Vaping Use: Never used  Substance and Sexual Activity   Alcohol use: No   Drug use: No   Sexual activity: Not on file  Other Topics Concern   Not on file  Social History Narrative   Not on file   Social Determinants of Health   Financial Resource Strain: Not on file  Food Insecurity: Not on file  Transportation Needs: Not on file  Physical Activity: Not on file  Stress: Not on file  Social Connections: Not on file    Family History  Problem Relation Age of Onset   Heart disease Other    Cancer Other    Asthma Other    Diabetes Other    Arthritis Mother     Outpatient Encounter Medications as of 04/19/2021  Medication Sig   amoxicillin (AMOXIL) 500 MG tablet take 1 tablet by oral route every 8 hours for dental infection   aspirin EC 81 MG tablet Take 81 mg by mouth daily.    Blood Glucose Monitoring Suppl (ACCU-CHEK GUIDE) w/Device KIT 1 Piece by Does not apply route as directed.   celecoxib (CELEBREX) 200 MG capsule Take 200 mg by mouth daily.   Cholecalciferol 125 MCG (5000 UT) TABS Take 5,000 Units by mouth daily.   cyclobenzaprine (FLEXERIL) 5 MG tablet Take 5 mg by mouth 3 (three) times daily as needed.   Docusate Calcium (STOOL SOFTENER PO) Take 2 tablets by mouth daily.   DULoxetine (CYMBALTA) 60 MG capsule Take 60 mg by mouth daily.   glucose blood (ACCU-CHEK GUIDE) test strip Use as instructed   hydrochlorothiazide (MICROZIDE) 12.5 MG capsule Take 12.5 mg by mouth daily.   HYDROcodone-acetaminophen (NORCO/VICODIN) 5-325 MG tablet Take 2 tablets by mouth 2 (two) times daily.   losartan (COZAAR) 100 MG tablet Take 100 mg by mouth daily.   polyethylene glycol powder (GLYCOLAX/MIRALAX) 17 GM/SCOOP powder Take 17 g by mouth daily.  prednisoLONE acetate (PRED FORTE) 1 % ophthalmic suspension 1 drop 2 (two) times daily.   pregabalin (LYRICA) 75 MG capsule Take 75 mg by mouth 2 (two) times daily.   RESTASIS MULTIDOSE 0.05 % ophthalmic emulsion SMARTSIG:In Eye(s)   [DISCONTINUED] Continuous Blood Gluc Sensor (FREESTYLE LIBRE 14 DAY SENSOR) MISC 1 each by Does not apply route every 14 (fourteen) days. Change sensor every 14 days   [DISCONTINUED] glucose blood (ACCU-CHEK GUIDE) test strip 1 each by Other route in the morning, at noon, in the evening, and at bedtime. Use as instructed   [DISCONTINUED] insulin degludec (TRESIBA FLEXTOUCH) 100 UNIT/ML FlexTouch Pen Inject 60 Units into the skin daily.   [DISCONTINUED] insulin lispro (HUMALOG KWIKPEN) 100 UNIT/ML KwikPen Inject 0.18 mLs (18 Units total) into the skin 3 (three) times daily. (Patient taking differently: Inject 22 Units into the skin 3 (three) times daily.)   Continuous Blood Gluc Sensor (FREESTYLE LIBRE 14 DAY SENSOR) MISC 1 each by Does not apply route every 14 (fourteen) days. Change sensor every 14 days    insulin degludec (TRESIBA FLEXTOUCH) 100 UNIT/ML FlexTouch Pen Inject 70 Units into the skin at bedtime.   insulin lispro (HUMALOG KWIKPEN) 100 UNIT/ML KwikPen Inject 20-26 Units into the skin 3 (three) times daily.   [DISCONTINUED] insulin degludec (TRESIBA FLEXTOUCH) 100 UNIT/ML FlexTouch Pen Inject 70 Units into the skin at bedtime.   [DISCONTINUED] insulin lispro (HUMALOG KWIKPEN) 100 UNIT/ML KwikPen Inject 20-26 Units into the skin 3 (three) times daily.   [DISCONTINUED] meloxicam (MOBIC) 15 MG tablet Take 15 mg by mouth as needed. (Patient not taking: No sig reported)   No facility-administered encounter medications on file as of 04/19/2021.    ALLERGIES: Allergies  Allergen Reactions   Metformin    Metformin And Related     VACCINATION STATUS: Immunization History  Administered Date(s) Administered   Moderna Sars-Covid-2 Vaccination 11/23/2019, 12/21/2019    Diabetes She presents for her follow-up diabetic visit. She has type 2 diabetes mellitus. Onset time: Diagnosed at approx age of 34. Her disease course has been improving. There are no hypoglycemic associated symptoms. Associated symptoms include fatigue, polydipsia and polyuria. Pertinent negatives for diabetes include no blurred vision and no polyphagia. There are no hypoglycemic complications. Symptoms are improving. Diabetic complications include nephropathy. Risk factors for coronary artery disease include diabetes mellitus, dyslipidemia, family history, obesity, hypertension, sedentary lifestyle and stress. Current diabetic treatment includes intensive insulin program. She is compliant with treatment most of the time. Her weight is fluctuating minimally. She is following a generally unhealthy diet. When asked about meal planning, she reported none. She has not had a previous visit with a dietitian. She rarely participates in exercise. Her home blood glucose trend is decreasing steadily. Her breakfast blood glucose range is  generally >200 mg/dl. Her lunch blood glucose range is generally >200 mg/dl. Her dinner blood glucose range is generally >200 mg/dl. Her bedtime blood glucose range is generally >200 mg/dl. (She presents today with her CGM, no logs, showing improving glycemic profile, yet still above target.  Her POCT a1c is improving from 15% to 11.4% today.  She reports she has had many social stressors impacting her diabetes management but is now physically better, with plans to increase her exercise.  Analysis of her CGM shows TIR 34%, TAR 66%, TBR 0%.) An ACE inhibitor/angiotensin II receptor blocker is being taken. She does not see a podiatrist.Eye exam is current.  Hyperlipidemia This is a chronic problem. The current episode started more than 1  year ago. The problem is uncontrolled. Recent lipid tests were reviewed and are variable. Exacerbating diseases include chronic renal disease, diabetes and obesity. Factors aggravating her hyperlipidemia include thiazides. She is currently on no antihyperlipidemic treatment. Compliance problems include adherence to diet and adherence to exercise.  Risk factors for coronary artery disease include diabetes mellitus, dyslipidemia, hypertension, obesity, a sedentary lifestyle, stress and family history.  Hypertension This is a chronic problem. The current episode started more than 1 year ago. The problem has been waxing and waning since onset. The problem is uncontrolled. Pertinent negatives include no blurred vision. There are no associated agents to hypertension. Risk factors for coronary artery disease include diabetes mellitus, dyslipidemia, family history, obesity, sedentary lifestyle and stress. Past treatments include angiotensin blockers and diuretics. The current treatment provides moderate improvement. Compliance problems include diet and exercise.  Hypertensive end-organ damage includes kidney disease. Identifiable causes of hypertension include chronic renal disease.     Review of systems  Constitutional: + Minimally fluctuating body weight, current Body mass index is 47.81 kg/m., + fatigue, no subjective hyperthermia Eyes: no blurry vision, no xerophthalmia, reports chronic dry eyes ENT: no sore throat, no nodules palpated in throat, no dysphagia/odynophagia, no hoarseness Cardiovascular: no chest pain, no shortness of breath, no palpitations, no leg swelling Respiratory: no cough, no shortness of breath Gastrointestinal: no nausea/vomiting/diarrhea Musculoskeletal: + generalized aches/pains- hx of fibromyalgia Skin: no rashes, no hyperemia Neurological: no tremors, + numbness and tingling to B feet and right arm into fingers, no dizziness Psychiatric: no depression, no anxiety  Objective:     BP (!) 159/98   Pulse 85   Ht 5' (1.524 m)   Wt 244 lb 12.8 oz (111 kg)   LMP 04/07/2016   BMI 47.81 kg/m   Wt Readings from Last 3 Encounters:  04/19/21 244 lb 12.8 oz (111 kg)  02/18/21 244 lb 12.8 oz (111 kg)  01/21/21 239 lb (108.4 kg)     BP Readings from Last 3 Encounters:  04/19/21 (!) 159/98  02/18/21 133/86  07/06/20 (!) 150/102     Physical Exam- Limited  Constitutional:  Body mass index is 47.81 kg/m. , not in acute distress, normal state of mind Eyes:  EOMI, no exophthalmos Neck: Supple Cardiovascular: RRR, no murmurs, rubs, or gallops, no edema Respiratory: Adequate breathing efforts, no crackles, rales, rhonchi, or wheezing Musculoskeletal: no gross deformities, strength intact in all four extremities, no gross restriction of joint movements Skin:  no rashes, no hyperemia Neurological: no tremor with outstretched hands  POCT ABI Results 04/19/21   Right ABI:  1.04      Left ABI:  1.07  Right leg systolic / diastolic: 219/75 mmHg Left leg systolic / diastolic: 883/25 mmHg  Arm systolic / diastolic: 498/26 mmHG  Detailed report will be scanned into patient chart.  CMP ( most recent) CMP     Component Value  Date/Time   ALKPHOS 232 (A) 12/25/2020 0000   GFRAA 68 12/25/2020 0000     Diabetic Labs (most recent): Lab Results  Component Value Date   HGBA1C 11.4 (A) 04/19/2021   HGBA1C 15 12/25/2020   HGBA1C 13.4 (A) 04/09/2020     Lipid Panel ( most recent) Lipid Panel     Component Value Date/Time   CHOL 164 05/19/2017 0000   TRIG 316 (A) 12/25/2020 0000   HDL 66 05/19/2017 0000   LDLCALC 78 12/25/2020 0000      No results found for: TSH, FREET4  Assessment & Plan:   1) Uncontrolled type 2 diabetes mellitus with hyperglycemia ([place])  She presents today with her CGM, no logs, showing improving glycemic profile, yet still above target.  Her POCT a1c is improving from 15% to 11.4% today.  She reports she has had many social stressors impacting her diabetes management but is now physically better, with plans to increase her exercise.  Analysis of her CGM shows TIR 34%, TAR 66%, TBR 0%.   - Nicole [name] has currently uncontrolled symptomatic type 2 DM since 55 years of age.  -Recent labs reviewed.  - I had a long discussion with her about the progressive nature of diabetes and the pathology behind its complications. -her diabetes is complicated by CKD stage 2, retinopathy, neuropathy and she remains at a high risk for more acute and chronic complications which include CAD, CVA, CKD, retinopathy, and neuropathy. These are all discussed in detail with her.  - Nutritional counseling repeated at each appointment due to patients tendency to fall back in to old habits.  - The patient admits there is a room for improvement in their diet and drink choices. -  Suggestion is made for the patient to avoid simple carbohydrates from their diet including Cakes, Sweet Desserts / Pastries, Ice Cream, Soda (diet and regular), Sweet Tea, Candies, Chips, Cookies, Sweet Pastries, Store Bought Juices, Alcohol in Excess of 1-2 drinks a day, Artificial Sweeteners, Coffee Creamer, and  "Sugar-free" Products. This will help patient to have stable blood glucose profile and potentially avoid unintended weight gain.   - I encouraged the patient to switch to unprocessed or minimally processed complex starch and increased protein intake (animal or plant source), fruits, and vegetables.   - Patient is advised to stick to a routine mealtimes to eat 3 meals a day and avoid unnecessary snacks (to snack only to correct hypoglycemia).  - I have approached her with the following individualized plan to manage her diabetes and patient agrees:   -Based on her current glycemic profile, she is advised to increase her Tresiba to 70 units SQ nightly and continue current dose of Humalog 20-26 units TID with meals if glucose is above 90 and she is eating (specific instructions on how to titrate insulin dose based on glucose readings given to patient in writing).  -she is encouraged to continue monitoring glucose 4 times daily (using her CGM), before meals and before bed, and to call the clinic if she has readings less than 70 or above 300 for 3 tests in a row.  - she is warned not to take insulin without proper monitoring per orders. - Adjustment parameters are given to her for hypo and hyperglycemia in writing.  - she is not an ideal candidate for incretin therapy due to elevated triglycerides putting her at greater risk for pancreatitis.  - Specific targets for  A1c; LDL, HDL, and Triglycerides were discussed with the patient.  2) Blood Pressure /Hypertension:  her blood pressure is not controlled to target.   she is advised to continue her current medications including Losartan 100 mg p.o. daily with breakfast, and HCTZ 12.5 mg po daily.  3) Lipids/Hyperlipidemia:    Review of her recent lipid panel from 12/25/20 showed controlled LDL at 78 and elevated triglycerides of 316. She is advised to avoid fried foods and butter.  4)  Weight/Diet:  her Body mass index is 47.81 kg/m.  -  clearly  complicating her diabetes care.   she is a candidate for weight  loss. I discussed with her the fact that loss of 5 - 10% of her  current body weight will have the most impact on her diabetes management.  Exercise, and detailed carbohydrates information provided  -  detailed on discharge instructions.  5) Chronic Care/Health Maintenance: -she is on ACEI/ARB and is encouraged to initiate and continue to follow up with Ophthalmology, Dentist, Podiatrist at least yearly or according to recommendations, and advised to stay away from smoking. I have recommended yearly flu vaccine and pneumonia vaccine at least every 5 years; moderate intensity exercise for up to 150 minutes weekly; and sleep for at least 7 hours a day.  - she is advised to maintain close follow up with Sherrilee Gilles, DO for primary care needs, as well as her other providers for optimal and coordinated care.       I spent 47 minutes in the care of the patient today including review of labs from [facility], Lipids, Thyroid Function, Hematology (current and previous including abstractions from other facilities); face-to-face time discussing  her blood glucose readings/logs, discussing hypoglycemia and hyperglycemia episodes and symptoms, medications doses, her options of short and long term treatment based on the latest standards of care / guidelines;  discussion about incorporating lifestyle medicine;  and documenting the encounter.    Please refer to Patient Instructions for Blood Glucose Monitoring and Insulin/Medications Dosing Guide"  in media tab for additional information. Please  also refer to " Patient Self Inventory" in the Media  tab for reviewed elements of pertinent patient history.  Nicole [name] participated in the discussions, expressed understanding, and voiced agreement with the above plans.  All questions were answered to her satisfaction. she is encouraged to contact clinic should she have any questions or concerns prior to  her return visit.     Follow up plan: - Return in about 1 month (around 05/19/2021) for Diabetes F/U, Bring meter and logs, Previsit labs.    Rayetta Pigg, [facility] [address] [place], [place] [address] Phone: [phone] Fax: [phone]  04/19/2021, 11:51 AM

## 2021-05-17 ENCOUNTER — Ambulatory Visit: Payer: BC Managed Care – PPO | Admitting: Nurse Practitioner

## 2021-05-23 DIAGNOSIS — E1165 Type 2 diabetes mellitus with hyperglycemia: Secondary | ICD-10-CM | POA: Diagnosis not present

## 2021-05-24 LAB — TSH: TSH: 3.12 u[IU]/mL (ref 0.450–4.500)

## 2021-05-24 LAB — VITAMIN D 25 HYDROXY (VIT D DEFICIENCY, FRACTURES): Vit D, 25-Hydroxy: 41.3 ng/mL (ref 30.0–100.0)

## 2021-05-24 LAB — COMPREHENSIVE METABOLIC PANEL
ALT: 13 IU/L (ref 0–32)
AST: 12 IU/L (ref 0–40)
Albumin/Globulin Ratio: 1.4 (ref 1.2–2.2)
Albumin: 4.2 g/dL (ref 3.8–4.9)
Alkaline Phosphatase: 133 IU/L — ABNORMAL HIGH (ref 44–121)
BUN/Creatinine Ratio: 17 (ref 9–23)
BUN: 17 mg/dL (ref 6–24)
Bilirubin Total: 0.4 mg/dL (ref 0.0–1.2)
CO2: 26 mmol/L (ref 20–29)
Calcium: 9.7 mg/dL (ref 8.7–10.2)
Chloride: 102 mmol/L (ref 96–106)
Creatinine, Ser: 1.01 mg/dL — ABNORMAL HIGH (ref 0.57–1.00)
Globulin, Total: 2.9 g/dL (ref 1.5–4.5)
Glucose: 243 mg/dL — ABNORMAL HIGH (ref 70–99)
Potassium: 4.5 mmol/L (ref 3.5–5.2)
Sodium: 140 mmol/L (ref 134–144)
Total Protein: 7.1 g/dL (ref 6.0–8.5)
eGFR: 67 mL/min/{1.73_m2} (ref >59)

## 2021-05-24 LAB — LIPID PANEL
Chol/HDL Ratio: 3.1 ratio (ref 0.0–4.4)
Cholesterol, Total: 157 mg/dL (ref 100–199)
HDL: 50 mg/dL (ref >39)
LDL Chol Calc (NIH): 87 mg/dL (ref 0–99)
Triglycerides: 109 mg/dL (ref 0–149)
VLDL Cholesterol Cal: 20 mg/dL (ref 5–40)

## 2021-05-24 LAB — T4, FREE: Free T4: 1.12 ng/dL (ref 0.82–1.77)

## 2021-08-14 ENCOUNTER — Telehealth: Payer: Self-pay | Admitting: Nurse Practitioner

## 2021-08-14 NOTE — Telephone Encounter (Signed)
Patient is requesting an appt. Her labs are from October, do you want updated labs?

## 2021-08-14 NOTE — Telephone Encounter (Signed)
No, those will do

## 2021-08-15 NOTE — Patient Instructions (Signed)

## 2021-08-16 ENCOUNTER — Other Ambulatory Visit: Payer: Self-pay

## 2021-08-16 ENCOUNTER — Encounter: Payer: Self-pay | Admitting: Nurse Practitioner

## 2021-08-16 ENCOUNTER — Ambulatory Visit: Payer: BC Managed Care – PPO | Admitting: Nurse Practitioner

## 2021-08-16 VITALS — BP 123/85 | HR 90 | Ht 60.0 in | Wt 241.6 lb

## 2021-08-16 DIAGNOSIS — E1165 Type 2 diabetes mellitus with hyperglycemia: Secondary | ICD-10-CM | POA: Diagnosis not present

## 2021-08-16 DIAGNOSIS — E782 Mixed hyperlipidemia: Secondary | ICD-10-CM

## 2021-08-16 DIAGNOSIS — I10 Essential (primary) hypertension: Secondary | ICD-10-CM

## 2021-08-16 LAB — POCT GLYCOSYLATED HEMOGLOBIN (HGB A1C): HbA1c, POC (controlled diabetic range): 11.2 % — AB (ref 0.0–7.0)

## 2021-08-16 MED ORDER — BD PEN NEEDLE SHORT U/F 31G X 8 MM MISC: 1.0000 | 3 refills | Status: DC

## 2021-08-16 MED ORDER — TOUJEO MAX SOLOSTAR 300 UNIT/ML ~~LOC~~ SOPN: 60.0000 [IU] | PEN_INJECTOR | Freq: Every evening | SUBCUTANEOUS | 3 refills | Status: DC

## 2021-08-16 MED ORDER — OZEMPIC (0.25 OR 0.5 MG/DOSE) 2 MG/1.5ML ~~LOC~~ SOPN: 0.5000 mg | PEN_INJECTOR | SUBCUTANEOUS | 3 refills | Status: DC

## 2021-08-16 MED ORDER — FREESTYLE LIBRE 14 DAY SENSOR MISC: 1.0000 | 2 refills | Status: DC

## 2021-08-16 NOTE — Progress Notes (Signed)
Endocrinology Follow Up Note       08/16/2021, 10:49 AM   Subjective:    Patient ID: Nicole [name], female    DOB: [birth year].  Nicole [name] is being seen in follow up after being seen in consultation for management of currently uncontrolled symptomatic diabetes requested by  Sherrilee Gilles, DO.   Past Medical History:  Diagnosis Date   Depression    Diabetes mellitus    Fibromyalgia    GERD (gastroesophageal reflux disease)    HTN (hypertension)    Obesity    RLS (restless legs syndrome)    Vitamin D deficiency    Vitamin D deficiency     Past Surgical History:  Procedure Laterality Date   CATARACT EXTRACTION     CESAREAN SECTION     TUBAL LIGATION      Social History   Socioeconomic History   Marital status: Married    Spouse name: Not on file   Number of children: Not on file   Years of education: MBA   Highest education level: Not on file  Occupational History   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  Tobacco Use   Smoking status: Never   Smokeless tobacco: Never  Vaping Use   Vaping Use: Never used  Substance and Sexual Activity   Alcohol use: No   Drug use: No   Sexual activity: Not on file  Other Topics Concern   Not on file  Social History Narrative   Not on file   Social Determinants of Health   Financial Resource Strain: Not on file  Food Insecurity: Not on file  Transportation Needs: Not on file  Physical Activity: Not on file  Stress: Not on file  Social Connections: Not on file    Family History  Problem Relation Age of Onset   Heart disease Other    Cancer Other    Asthma Other    Diabetes Other    Arthritis Mother     Outpatient Encounter Medications as of 08/16/2021  Medication Sig   amoxicillin (AMOXIL) 500 MG tablet take 1 tablet by oral route every 8 hours for dental infection   aspirin EC 81 MG tablet Take 81 mg by mouth daily.    Blood Glucose Monitoring Suppl (ACCU-CHEK GUIDE) w/Device KIT 1 Piece by Does not apply route as directed.   celecoxib (CELEBREX) 200 MG capsule Take 200 mg by mouth daily.   Cholecalciferol 125 MCG (5000 UT) TABS Take 5,000 Units by mouth daily.   cyclobenzaprine (FLEXERIL) 5 MG tablet Take 5 mg by mouth 3 (three) times daily as needed.   Docusate Calcium (STOOL SOFTENER PO) Take 2 tablets by mouth daily.   DULoxetine (CYMBALTA) 60 MG capsule Take 60 mg by mouth daily.   glucose blood (ACCU-CHEK GUIDE) test strip Use as instructed   hydrochlorothiazide (MICROZIDE) 12.5 MG capsule Take 12.5 mg by mouth daily.   HYDROcodone-acetaminophen (NORCO/VICODIN) 5-325 MG tablet Take 2 tablets by mouth 2 (two) times daily.   insulin glargine, 2 Unit Dial, (TOUJEO MAX SOLOSTAR) 300 UNIT/ML Solostar Pen Inject 60 Units into the skin at bedtime.   insulin lispro (HUMALOG KWIKPEN) 100 UNIT/ML  KwikPen Inject 20-26 Units into the skin 3 (three) times daily.   Insulin Pen Needle (B-D ULTRAFINE III SHORT PEN) 31G X 8 MM MISC 1 each by Does not apply route as directed. Use to inject insulin 4 times daily   losartan (COZAAR) 100 MG tablet Take 100 mg by mouth daily.   mupirocin ointment (BACTROBAN) 2 % Apply topically 2 (two) times daily.   polyethylene glycol powder (GLYCOLAX/MIRALAX) 17 GM/SCOOP powder Take 17 g by mouth daily.   prednisoLONE acetate (PRED FORTE) 1 % ophthalmic suspension 1 drop 2 (two) times daily.   pregabalin (LYRICA) 100 MG capsule Take 100 mg by mouth 2 (two) times daily.   RESTASIS MULTIDOSE 0.05 % ophthalmic emulsion SMARTSIG:In Eye(s)   Semaglutide,0.25 or 0.5MG/DOS, (OZEMPIC, 0.25 OR 0.5 MG/DOSE,) 2 MG/1.5ML SOPN Inject 0.5 mg into the skin once a week.   [DISCONTINUED] Continuous Blood Gluc Sensor (FREESTYLE LIBRE 14 DAY SENSOR) MISC 1 each by Does not apply route every 14 (fourteen) days. Change sensor every 14 days   [DISCONTINUED] insulin degludec (TRESIBA FLEXTOUCH) 100 UNIT/ML  FlexTouch Pen Inject 70 Units into the skin at bedtime.   Continuous Blood Gluc Sensor (FREESTYLE LIBRE 14 DAY SENSOR) MISC 1 each by Does not apply route every 14 (fourteen) days. Change sensor every 14 days   [DISCONTINUED] pregabalin (LYRICA) 75 MG capsule Take 75 mg by mouth 2 (two) times daily. (Patient not taking: Reported on 08/16/2021)   No facility-administered encounter medications on file as of 08/16/2021.    ALLERGIES: Allergies  Allergen Reactions   Metformin    Metformin And Related     VACCINATION STATUS: Immunization History  Administered Date(s) Administered   Moderna Sars-Covid-2 Vaccination 11/23/2019, 12/21/2019    Diabetes She presents for her follow-up diabetic visit. She has type 2 diabetes mellitus. Onset time: Diagnosed at approx age of 62. Her disease course has been stable. There are no hypoglycemic associated symptoms. Associated symptoms include fatigue, polydipsia and polyuria. Pertinent negatives for diabetes include no blurred vision and no polyphagia. There are no hypoglycemic complications. Symptoms are stable. Diabetic complications include nephropathy. Risk factors for coronary artery disease include diabetes mellitus, dyslipidemia, family history, obesity, hypertension, sedentary lifestyle and stress. Current diabetic treatment includes intensive insulin program. She is compliant with treatment most of the time. Her weight is fluctuating minimally. She is following a generally unhealthy diet. When asked about meal planning, she reported none. She has not had a previous visit with a dietitian. She rarely participates in exercise. Her home blood glucose trend is fluctuating minimally. (She presents today with her CGM showing limited data (she had run out of sensors and has not been checking her glucose with back up method).  Her POCT A1c today is 11.2%, slightly improved from last visit of 11.4%.  She denies any hypoglycemia.  She has been under a great amount of  stress lately but has plans to start putting her health first.  She asks about meds to help with weight loss as well today.) An ACE inhibitor/angiotensin II receptor blocker is being taken. She does not see a podiatrist.Eye exam is current.  Hyperlipidemia This is a chronic problem. The current episode started more than 1 year ago. The problem is controlled. Recent lipid tests were reviewed and are normal. Exacerbating diseases include chronic renal disease, diabetes and obesity. Factors aggravating her hyperlipidemia include thiazides. She is currently on no antihyperlipidemic treatment. Compliance problems include adherence to diet, adherence to exercise and psychosocial issues.  Risk factors  for coronary artery disease include diabetes mellitus, dyslipidemia, hypertension, obesity, a sedentary lifestyle, stress and family history.  Hypertension This is a chronic problem. The current episode started more than 1 year ago. The problem has been gradually improving since onset. The problem is controlled. Pertinent negatives include no blurred vision. There are no associated agents to hypertension. Risk factors for coronary artery disease include diabetes mellitus, dyslipidemia, family history, obesity, sedentary lifestyle and stress. Past treatments include angiotensin blockers and diuretics. The current treatment provides moderate improvement. Compliance problems include diet and exercise.  Hypertensive end-organ damage includes kidney disease. Identifiable causes of hypertension include chronic renal disease.    Review of systems  Constitutional: + Minimally fluctuating body weight, current Body mass index is 47.18 kg/m., + fatigue, no subjective hyperthermia Eyes: no blurry vision, no xerophthalmia, reports chronic dry eyes ENT: no sore throat, no nodules palpated in throat, no dysphagia/odynophagia, no hoarseness Cardiovascular: no chest pain, no shortness of breath, no palpitations, no leg  swelling Respiratory: no cough, no shortness of breath Gastrointestinal: no nausea/vomiting/diarrhea Musculoskeletal: + generalized aches/pains- hx of fibromyalgia Skin: no rashes, no hyperemia Neurological: no tremors, + numbness and tingling to B feet and right arm into fingers, no dizziness Psychiatric: + depression and anxiety- reports multiple social stressors  Objective:     BP 123/85    Pulse 90    Ht 5' (1.524 m)    Wt 241 lb 9.6 oz (109.6 kg)    LMP 04/07/2016    SpO2 100%    BMI 47.18 kg/m   Wt Readings from Last 3 Encounters:  08/16/21 241 lb 9.6 oz (109.6 kg)  04/19/21 244 lb 12.8 oz (111 kg)  02/18/21 244 lb 12.8 oz (111 kg)     BP Readings from Last 3 Encounters:  08/16/21 123/85  04/19/21 (!) 159/98  02/18/21 133/86      Physical Exam- Limited  Constitutional:  Body mass index is 47.18 kg/m. , not in acute distress, anxious/tearful state of mind Eyes:  EOMI, no exophthalmos Neck: Supple Cardiovascular: RRR, no murmurs, rubs, or gallops, no edema Respiratory: Adequate breathing efforts, no crackles, rales, rhonchi, or wheezing Musculoskeletal: no gross deformities, strength intact in all four extremities, no gross restriction of joint movements Skin:  no rashes, no hyperemia Neurological: no tremor with outstretched hands    CMP ( most recent) CMP     Component Value Date/Time   NA 140 05/23/2021 0940   K 4.5 05/23/2021 0940   CL 102 05/23/2021 0940   CO2 26 05/23/2021 0940   GLUCOSE 243 (H) 05/23/2021 0940   BUN 17 05/23/2021 0940   CREATININE 1.01 (H) 05/23/2021 0940   CALCIUM 9.7 05/23/2021 0940   PROT 7.1 05/23/2021 0940   ALBUMIN 4.2 05/23/2021 0940   AST 12 05/23/2021 0940   ALT 13 05/23/2021 0940   ALKPHOS 133 (H) 05/23/2021 0940   BILITOT 0.4 05/23/2021 0940   GFRAA 68 12/25/2020 0000     Diabetic Labs (most recent): Lab Results  Component Value Date   HGBA1C 11.2 (A) 08/16/2021   HGBA1C 11.4 (A) 04/19/2021   HGBA1C 15  12/25/2020     Lipid Panel ( most recent) Lipid Panel     Component Value Date/Time   CHOL 157 05/23/2021 0940   TRIG 109 05/23/2021 0940   HDL 50 05/23/2021 0940   CHOLHDL 3.1 05/23/2021 0940   LDLCALC 87 05/23/2021 0940   LABVLDL 20 05/23/2021 0940      Lab Results  Component  Value Date   TSH 3.120 05/23/2021   FREET4 1.12 05/23/2021           Assessment & Plan:   1) Uncontrolled type 2 diabetes mellitus with hyperglycemia ([place])   She presents today with her CGM showing limited data (she had run out of sensors and has not been checking her glucose with back up method).  Her POCT A1c today is 11.2%, slightly improved from last visit of 11.4%.  She denies any hypoglycemia.  She has been under a great amount of stress lately but has plans to start putting her health first.  She asks about meds to help with weight loss as well today.  She also notes she had trouble refilling some of her medications due to pharmacy availability.  - Nicole [name] has currently uncontrolled symptomatic type 2 DM since 56 years of age.  -Recent labs reviewed.  - I had a long discussion with her about the progressive nature of diabetes and the pathology behind its complications. -her diabetes is complicated by CKD stage 2, retinopathy, neuropathy and she remains at a high risk for more acute and chronic complications which include CAD, CVA, CKD, retinopathy, and neuropathy. These are all discussed in detail with her.  - Nutritional counseling repeated at each appointment due to patients tendency to fall back in to old habits.  - The patient admits there is a room for improvement in their diet and drink choices. -  Suggestion is made for the patient to avoid simple carbohydrates from their diet including Cakes, Sweet Desserts / Pastries, Ice Cream, Soda (diet and regular), Sweet Tea, Candies, Chips, Cookies, Sweet Pastries, Store Bought Juices, Alcohol in Excess of 1-2 drinks a day, Artificial  Sweeteners, Coffee Creamer, and "Sugar-free" Products. This will help patient to have stable blood glucose profile and potentially avoid unintended weight gain.   - I encouraged the patient to switch to unprocessed or minimally processed complex starch and increased protein intake (animal or plant source), fruits, and vegetables.   - Patient is advised to stick to a routine mealtimes to eat 3 meals a day and avoid unnecessary snacks (to snack only to correct hypoglycemia).  - I have approached her with the following individualized plan to manage her diabetes and patient agrees:   -Given the lack of logs to review, no changes will be made to her insulin regimen today for safety purposes.  She is advised to continue Toujeo 60 units SQ nightly (switched from [place] due to pharmacy availability) and continue her Humalog 22-28 units TID with meals if glucose is above 90 and she is eating (Specific instructions on how to titrate insulin dosage based on glucose readings given to patient in writing).  I did discuss and initiate Ozempic 0.25 mg SQ weekly x 2 weeks, then may advance to 0.5 mg SQ weekly thereafter if tolerated well (sample provided from office today).   -she is encouraged to continue monitoring glucose 4 times daily (using her CGM), before meals and before bed, and to call the clinic if she has readings less than 70 or above 300 for 3 tests in a row.  - she is warned not to take insulin without proper monitoring per orders. - Adjustment parameters are given to her for hypo and hyperglycemia in writing.  - Specific targets for  A1c; LDL, HDL, and Triglycerides were discussed with the patient.  2) Blood Pressure /Hypertension:  her blood pressure is controlled to target.   she is advised to  continue her current medications including Losartan 100 mg p.o. daily with breakfast, and HCTZ 12.5 mg po daily.  3) Lipids/Hyperlipidemia:    Review of her recent lipid panel from 05/23/21 showed  controlled LDL at 87. She is advised to avoid fried foods and butter.  4)  Weight/Diet:  her Body mass index is 47.18 kg/m.  -  clearly complicating her diabetes care.   she is a candidate for weight loss. I discussed with her the fact that loss of 5 - 10% of her  current body weight will have the most impact on her diabetes management.  Exercise, and detailed carbohydrates information provided  -  detailed on discharge instructions.  5) Chronic Care/Health Maintenance: -she is on ACEI/ARB and is encouraged to initiate and continue to follow up with Ophthalmology, Dentist, Podiatrist at least yearly or according to recommendations, and advised to stay away from smoking. I have recommended yearly flu vaccine and pneumonia vaccine at least every 5 years; moderate intensity exercise for up to 150 minutes weekly; and sleep for at least 7 hours a day.  - she is advised to maintain close follow up with Sherrilee Gilles, DO for primary care needs, as well as her other providers for optimal and coordinated care.      I spent 44 minutes in the care of the patient today including review of labs from [facility] [place], Lipids, Thyroid Function, Hematology (current and previous including abstractions from other facilities); face-to-face time discussing  her blood glucose readings/logs, discussing hypoglycemia and hyperglycemia episodes and symptoms, medications doses, her options of short and long term treatment based on the latest standards of care / guidelines;  discussion about incorporating lifestyle medicine;  and documenting the encounter.    Please refer to Patient Instructions for Blood Glucose Monitoring and Insulin/Medications Dosing Guide"  in media tab for additional information. Please  also refer to " Patient Self Inventory" in the Media  tab for reviewed elements of pertinent patient history.  Nicole [name] participated in the discussions, expressed understanding, and voiced agreement with the above plans.   All questions were answered to her satisfaction. she is encouraged to contact clinic should she have any questions or concerns prior to her return visit.     Follow up plan: - Return in about 3 months (around 11/14/2021) for Diabetes F/U with A1c in office, No previsit labs, Bring meter and logs.    Rayetta Pigg, [facility] [address] [place], [place] [address] Phone: [phone] Fax: [phone]  08/16/2021, 10:49 AM

## 2021-08-20 ENCOUNTER — Telehealth: Payer: Self-pay

## 2021-08-20 NOTE — Telephone Encounter (Signed)
Called patient and left a detailed voice message for patient to call me back because her prior authorization on Cover My Meds is not recognizing her insurance for her Ozempic.

## 2021-08-27 NOTE — Telephone Encounter (Signed)
Called patient and was sent to voice mail and I left a detailed voice message for patient to call back to clarify which insurance she is using since her pharmacy has several insurances on file and I am trying to proceed with her Ozempic prior authorization and the insurance that we have on file is not being accepted on Cover My Meds. I have made patient aware on message that if she does not call back I will not be able to proceed with the authorization.

## 2021-09-03 ENCOUNTER — Encounter: Payer: Self-pay | Admitting: Nurse Practitioner

## 2021-09-03 DIAGNOSIS — R1084 Generalized abdominal pain: Secondary | ICD-10-CM

## 2021-09-03 DIAGNOSIS — R112 Nausea with vomiting, unspecified: Secondary | ICD-10-CM

## 2021-09-03 MED ORDER — TRESIBA FLEXTOUCH 200 UNIT/ML ~~LOC~~ SOPN: 60.0000 [IU] | PEN_INJECTOR | Freq: Every evening | SUBCUTANEOUS | 3 refills | Status: DC

## 2021-10-10 ENCOUNTER — Telehealth: Payer: Self-pay

## 2021-10-10 NOTE — Telephone Encounter (Signed)
Faxed prior authorization for Ozempic to [facility] at [id number]. ?

## 2021-10-11 NOTE — Telephone Encounter (Signed)
Received a fax from [facility] that the Ozempic 2/1.29ml has been Approved through 10/11/2022. ?

## 2021-11-14 ENCOUNTER — Ambulatory Visit: Payer: BC Managed Care – PPO | Admitting: Nurse Practitioner

## 2021-11-28 ENCOUNTER — Ambulatory Visit: Payer: BC Managed Care – PPO | Admitting: Internal Medicine

## 2021-11-28 ENCOUNTER — Encounter: Payer: Self-pay | Admitting: Internal Medicine

## 2021-11-28 VITALS — BP 144/82 | HR 95 | Ht 61.0 in | Wt 239.0 lb

## 2021-11-28 DIAGNOSIS — R933 Abnormal findings on diagnostic imaging of other parts of digestive tract: Secondary | ICD-10-CM

## 2021-11-28 DIAGNOSIS — K219 Gastro-esophageal reflux disease without esophagitis: Secondary | ICD-10-CM

## 2021-11-28 DIAGNOSIS — K5900 Constipation, unspecified: Secondary | ICD-10-CM

## 2021-11-28 DIAGNOSIS — R1010 Upper abdominal pain, unspecified: Secondary | ICD-10-CM

## 2021-11-28 DIAGNOSIS — R1319 Other dysphagia: Secondary | ICD-10-CM

## 2021-11-28 MED ORDER — PANTOPRAZOLE SODIUM 40 MG PO TBEC: 40.0000 mg | DELAYED_RELEASE_TABLET | Freq: Two times a day (BID) | ORAL | 3 refills | Status: DC

## 2021-11-28 NOTE — Progress Notes (Signed)
HISTORY OF PRESENT ILLNESS: ? ?Nicole [name] is a 56 y.o. female with morbid obesity, diabetes mellitus, hypertension, sleep apnea, and fibromyalgia.  She presents today regarding worsening reflux symptoms, constipation, and persistent left upper quadrant pain.  The patient was initially evaluated in this office October 13, 2019 regarding chronic GERD, intermittent solid food dysphagia, chronic constipation, and left upper quadrant pain.  See that dictation for details.  She was placed on pantoprazole 40 mg twice daily, regular MiraLAX, and scheduled for barium swallow.  Barium swallow on October 14, 2019 revealed a nonspecific esophageal motility disorder, possible esophagitis, small hiatal hernia, and a benign-appearing distal esophageal stricture with with impaction of 13 mm barium tablet.  Upper endoscopy suggested.  Patient was provided these results, told to continue her PPI, and follow-up in the office in 6 weeks.  She did not.  She tells me that her symptoms improved on medical therapies. ? ?She tells me that she ran out of her PPI.  Since that time she has had worsening reflux symptoms.  Some dysphagia.  Significant regurgitation.  She also continues to complain of left upper quadrant discomfort.  She states this is worse.  She has lost weight but she attributes this to Ozempic.  She no longer takes hydrocodone but does take Celebrex.  She also complains of constipation with a bowel movement most days but may go 3 days without a bowel movement.  She takes MiraLAX somewhat regularly.  No bleeding.  She has had colonoscopy elsewhere shortly before the pandemic.  Review of blood work from October 2000 22nd reveals unremarkable comprehensive metabolic panel except for elevated glucose. ? ?REVIEW OF SYSTEMS: ? ?All non-GI ROS negative unless otherwise stated in the HPI except for sinus and allergy trouble, anxiety, arthritis, back pain, visual change, fatigue, headaches, muscle cramps, night sweats, sleeping  problems, ankle swelling ? ?Past Medical History:  ?Diagnosis Date  ? Depression   ? Diabetes mellitus   ? Fibromyalgia   ? GERD (gastroesophageal reflux disease)   ? HTN (hypertension)   ? Obesity   ? RLS (restless legs syndrome)   ? Sleep apnea   ? Vitamin D deficiency   ? Vitamin D deficiency   ? ? ?Past Surgical History:  ?Procedure Laterality Date  ? CATARACT EXTRACTION    ? CESAREAN SECTION    ? TUBAL LIGATION    ? ? ?Social History ?Julio Alm  reports that she has never smoked. She has never used smokeless tobacco. She reports that she does not drink alcohol and does not use drugs. ? ?family history includes Arthritis in her mother; Asthma in an other family member; Cancer in an other family member; Diabetes in an other family member; Heart disease in an other family member. ? ?Allergies  ?Allergen Reactions  ? Metformin   ? ? ?  ? ?PHYSICAL EXAMINATION: ?Vital signs: BP (!) 144/82   Pulse 95   Ht 5\' 1"  (1.549 m)   Wt 239 lb (108.4 kg)   LMP 04/07/2016   SpO2 98%   BMI 45.16 kg/m?   ?Constitutional: Unhealthy appearing, obese, no acute distress ?Psychiatric: alert and oriented x3, cooperative.  Pleasant ?Eyes: extraocular movements intact, anicteric, conjunctiva pink ?Mouth: oral pharynx moist, no lesions ?Neck: supple no lymphadenopathy ?Cardiovascular: heart regular rate and rhythm, no murmur ?Lungs: clear to auscultation bilaterally ?Abdomen: soft, obese, nontender but uncomfortable with palpation in the upper abdomen, nondistended, no obvious ascites, no peritoneal signs, normal bowel sounds, no organomegaly ?Rectal: Omitted ?Extremities:  no clubbing, or cyanosis.  Trace lower extremity edema bilaterally ?Skin: no lesions on visible extremities ?Neuro: No focal deficits.  Cranial nerves intact ? ?ASSESSMENT: ? ?1.  Significant GERD symptoms off PPI ?2.  Dysphagia likely due to peptic stricture.  Abnormal esophagram as previously described. ?3.  Chronic persistent worsening left upper  quadrant pain.  Previously felt secondary to obesity.  Will evaluate further given ongoing and worsening nature. ?4.  Chronic constipation ?5.  Multiple medical problems including morbid obesity with current BMI 45 ? ? ?PLAN: ? ?1.  Reflux precautions ?2.  Ongoing weight loss ?3.  Prescribe pantoprazole 40 mg twice daily.  Medication risk reviewed ?4.  Recommend MiraLAX.  2 doses daily.  Adjust as needed to have daily bowel movements ?5.  Schedule upper endoscopy to evaluate GERD, dysphagia, and upper abdominal pain.  Possible esophageal dilation.  Patient is high risk given her comorbidities and body habitus.The nature of the procedure, as well as the risks, benefits, and alternatives were carefully and thoroughly reviewed with the patient. Ample time for discussion and questions allowed. The patient understood, was satisfied, and agreed to proceed.  ?6.  Schedule CT scan of the abdomen pelvis to further evaluate worsening upper abdominal pain. ?A total time of 40 minutes was spent preparing to see the patient, obtaining comprehensive history, performing medically appropriate physical exam, counseling the patient and educating the patient regarding multiple above listed issues, ordering medications, advanced radiology studies, and endoscopic procedure.  Finally, documenting clinical information in the health record ? ? ? ?  ?

## 2021-11-28 NOTE — Patient Instructions (Addendum)
If you are age 54 or older, your body mass index should be between 23-30. Your Body mass index is 45.16 kg/m?Marland Kitchen If this is out of the aforementioned range listed, please consider follow up with your Primary Care Provider. ? ?If you are age 71 or younger, your body mass index should be between 19-25. Your Body mass index is 45.16 kg/m?Marland Kitchen If this is out of the aformentioned range listed, please consider follow up with your Primary Care Provider.  ? ?________________________________________________________ ? ?The [facility] providers would like to encourage you to use [facility] to communicate with providers for non-urgent requests or questions.  Due to long hold times on the telephone, sending your provider a message by [facility] may be a faster and more efficient way to get a response.  Please allow 48 business hours for a response.  Please remember that this is for non-urgent requests.  ?_______________________________________________________ ? ?We have sent the following medications to your pharmacy for you to pick up at your convenience:  Pantoprazole - twice a day ? ?You have been scheduled for a CT scan of the abdomen and pelvis at [place] ([address]---this is in the same building as [employer]).  ? ?You are scheduled on 12/09/2021 at 2:30pm. You should arrive 15 minutes prior to your appointment time for registration. Please follow the written instructions below on the day of your exam: ? ?WARNING: IF YOU ARE ALLERGIC TO IODINE/X-RAY DYE, PLEASE NOTIFY RADIOLOGY IMMEDIATELY AT [phone]! YOU WILL BE GIVEN A 13 HOUR PREMEDICATION PREP. ? ?1) Do not eat anything after 10:30am (4 hours prior to your test) ?2) You have been given 2 bottles of oral contrast to drink. The solution may taste better if refrigerated, but do NOT add ice or any other liquid to this solution. Shake well before drinking. ?  ? Drink 1 bottle of contrast @ 12:30pm (2 hours prior to your exam) ? Drink 1 bottle of  contrast @ 1:30pm (1 hour prior to your exam) ? ?You may take any medications as prescribed with a small amount of water, if necessary. If you take any of the following medications: METFORMIN, GLUCOPHAGE, GLUCOVANCE, AVANDAMET, RIOMET, FORTAMET, ACTOPLUS MET, JANUMET, [place] or METAGLIP, you MAY be asked to HOLD this medication 48 hours AFTER the exam. ? ?The purpose of you drinking the oral contrast is to aid in the visualization of your intestinal tract. The contrast solution may cause some diarrhea. Depending on your individual set of symptoms, you may also receive an intravenous injection of x-ray contrast/dye. Plan on being at [facility] for 30 minutes or longer, depending on the type of exam you are having performed. ? ?This test typically takes 30-45 minutes to complete. ? ?If you have any questions regarding your exam or if you need to reschedule, you may call the CT department at [phone] between the hours of 8:00 am and 5:00 pm, Monday-Friday. ? ?_________________________________________________________ ?You have been scheduled for an endoscopy. Please follow written instructions given to you at your visit today. ?If you use inhalers (even only as needed), please bring them with you on the day of your procedure. ? ? ?

## 2021-11-28 NOTE — Addendum Note (Signed)
Addended by: Jeanine Luz on: 11/28/2021 04:07 PM ? ? Modules accepted: Orders ? ?

## 2021-12-06 ENCOUNTER — Telehealth: Payer: Self-pay | Admitting: Internal Medicine

## 2021-12-06 NOTE — Telephone Encounter (Signed)
Left message on machine to call back  

## 2021-12-06 NOTE — Telephone Encounter (Signed)
Patient called requesting to cancel the CT appt scheduled for Monday 12/09/21. ?

## 2021-12-09 ENCOUNTER — Inpatient Hospital Stay: Admission: RE | Admit: 2021-12-09 | Payer: BC Managed Care – PPO | Source: Ambulatory Visit

## 2021-12-09 NOTE — Telephone Encounter (Signed)
CT appt has been rescheduled. ?

## 2021-12-10 DIAGNOSIS — G4733 Obstructive sleep apnea (adult) (pediatric): Secondary | ICD-10-CM | POA: Diagnosis not present

## 2021-12-17 ENCOUNTER — Other Ambulatory Visit (HOSPITAL_COMMUNITY): Payer: Self-pay

## 2021-12-17 ENCOUNTER — Telehealth: Payer: Self-pay | Admitting: Pharmacy Technician

## 2021-12-17 NOTE — Telephone Encounter (Signed)
Patient Advocate Encounter  Received notification from [employer] that prior authorization for PANTOPRAZOLE 40MG  is required.   PA submitted on 5.23.23 Key [id number] Status is pending   [facility] will continue to follow  05-07-1976, CPhT Patient Advocate Phone: [phone]

## 2021-12-19 ENCOUNTER — Other Ambulatory Visit (INDEPENDENT_AMBULATORY_CARE_PROVIDER_SITE_OTHER): Payer: BC Managed Care – PPO

## 2021-12-19 ENCOUNTER — Other Ambulatory Visit (HOSPITAL_COMMUNITY): Payer: Self-pay

## 2021-12-19 ENCOUNTER — Inpatient Hospital Stay: Admission: RE | Admit: 2021-12-19 | Payer: BC Managed Care – PPO | Source: Ambulatory Visit

## 2021-12-19 DIAGNOSIS — R1319 Other dysphagia: Secondary | ICD-10-CM

## 2021-12-19 DIAGNOSIS — K219 Gastro-esophageal reflux disease without esophagitis: Secondary | ICD-10-CM

## 2021-12-19 DIAGNOSIS — R1010 Upper abdominal pain, unspecified: Secondary | ICD-10-CM | POA: Diagnosis not present

## 2021-12-19 DIAGNOSIS — R933 Abnormal findings on diagnostic imaging of other parts of digestive tract: Secondary | ICD-10-CM

## 2021-12-19 DIAGNOSIS — K5900 Constipation, unspecified: Secondary | ICD-10-CM | POA: Diagnosis not present

## 2021-12-19 LAB — BASIC METABOLIC PANEL
BUN: 12 mg/dL (ref 6–23)
CO2: 28 mEq/L (ref 19–32)
Calcium: 9.6 mg/dL (ref 8.4–10.5)
Chloride: 102 mEq/L (ref 96–112)
Creatinine, Ser: 0.97 mg/dL (ref 0.40–1.20)
GFR: 66.52 mL/min (ref >60.00)
Glucose, Bld: 291 mg/dL — ABNORMAL HIGH (ref 70–99)
Potassium: 3.7 mEq/L (ref 3.5–5.1)
Sodium: 137 mEq/L (ref 135–145)

## 2021-12-19 NOTE — Telephone Encounter (Signed)
Received notification from [facility] regarding a prior authorization for PANTOPRAZOLE 40MG . Authorization has been APPROVED from 5.23.23 to 5.23.24.   Per test claim, copay for 30 days supply is $1.70   Authorization # [id number]

## 2021-12-20 ENCOUNTER — Encounter: Payer: BC Managed Care – PPO | Admitting: Internal Medicine

## 2021-12-26 ENCOUNTER — Ambulatory Visit (INDEPENDENT_AMBULATORY_CARE_PROVIDER_SITE_OTHER)
Admission: RE | Admit: 2021-12-26 | Discharge: 2021-12-26 | Disposition: A | Discharge status: Home / Self Care | Payer: BC Managed Care – PPO | Source: Ambulatory Visit | Attending: Internal Medicine | Admitting: Internal Medicine

## 2021-12-26 DIAGNOSIS — R1110 Vomiting, unspecified: Secondary | ICD-10-CM | POA: Diagnosis not present

## 2021-12-26 DIAGNOSIS — K219 Gastro-esophageal reflux disease without esophagitis: Secondary | ICD-10-CM

## 2021-12-26 DIAGNOSIS — R1010 Upper abdominal pain, unspecified: Secondary | ICD-10-CM

## 2021-12-26 DIAGNOSIS — K5900 Constipation, unspecified: Secondary | ICD-10-CM | POA: Diagnosis not present

## 2021-12-26 DIAGNOSIS — R109 Unspecified abdominal pain: Secondary | ICD-10-CM | POA: Diagnosis not present

## 2021-12-26 MED ORDER — IOHEXOL 300 MG/ML  SOLN
100.0000 mL | Freq: Once | INTRAMUSCULAR | Status: AC | PRN
  Administered 2021-12-26: 100 mL via INTRAVENOUS

## 2021-12-30 ENCOUNTER — Encounter: Payer: Self-pay | Admitting: Internal Medicine

## 2021-12-30 ENCOUNTER — Ambulatory Visit (AMBULATORY_SURGERY_CENTER): Payer: BC Managed Care – PPO | Admitting: Internal Medicine

## 2021-12-30 VITALS — BP 122/86 | HR 87 | Temp 98.0°F | Resp 14 | Ht 61.0 in | Wt 239.0 lb

## 2021-12-30 DIAGNOSIS — K219 Gastro-esophageal reflux disease without esophagitis: Secondary | ICD-10-CM

## 2021-12-30 DIAGNOSIS — K449 Diaphragmatic hernia without obstruction or gangrene: Secondary | ICD-10-CM | POA: Diagnosis not present

## 2021-12-30 DIAGNOSIS — K2100 Gastro-esophageal reflux disease with esophagitis, without bleeding: Secondary | ICD-10-CM

## 2021-12-30 DIAGNOSIS — R1010 Upper abdominal pain, unspecified: Secondary | ICD-10-CM | POA: Diagnosis not present

## 2021-12-30 DIAGNOSIS — R1319 Other dysphagia: Secondary | ICD-10-CM | POA: Diagnosis not present

## 2021-12-30 MED ORDER — SODIUM CHLORIDE 0.9 % IV SOLN: 500.0000 mL | Freq: Once | INTRAVENOUS | Status: AC

## 2021-12-30 NOTE — Progress Notes (Signed)
Pt's states no medical or surgical changes since previsit or office visit. 

## 2021-12-30 NOTE — Progress Notes (Signed)
A and O x3. Report to RN. Tolerated MAC anesthesia well.Teeth unchanged after procedure. 

## 2021-12-30 NOTE — Op Note (Signed)
[facility] Patient Name: Nicole [name] Procedure Date: 12/30/2021 9:58 AM MRN: [id number] Endoscopist: Wilhemina Bonito. Marina Goodell , MD Age: 56 Referring MD:  Date of Birth: 03-08-1966 Gender: Female Account #: [id number] Procedure:                Upper GI endoscopy Indications:              Abdominal pain, Dysphagia, Esophageal reflux Medicines:                Monitored Anesthesia Care Procedure:                Pre-Anesthesia Assessment:                           - Prior to the procedure, a History and Physical                            was performed, and patient medications and                            allergies were reviewed. The patient's tolerance of                            previous anesthesia was also reviewed. The risks                            and benefits of the procedure and the sedation                            options and risks were discussed with the patient.                            All questions were answered, and informed consent                            was obtained. Prior Anticoagulants: The patient has                            taken no previous anticoagulant or antiplatelet                            agents. ASA Grade Assessment: III - A patient with                            severe systemic disease. After reviewing the risks                            and benefits, the patient was deemed in                            satisfactory condition to undergo the procedure.                           After obtaining informed consent, the endoscope was  passed under direct vision. Throughout the                            procedure, the patient's blood pressure, pulse, and                            oxygen saturations were monitored continuously. The                            GIF [id number] #[id number] was introduced through the                            mouth, and advanced to the second part of duodenum.                            The upper  GI endoscopy was accomplished without                            difficulty. The patient tolerated the procedure                            well. Scope In: Scope Out: Findings:                 The esophagus revealed mild distal esophagitis. No                            obvious stricture..                           The stomach was normal save a small hiatal hernia.                           The examined duodenum was normal.                           The cardia and gastric fundus were normal on                            retroflexion. Complications:            No immediate complications. Estimated Blood Loss:     Estimated blood loss: none. Impression:               1. GERD with mild esophagitis                           2. Small hiatal hernia                           3. Otherwise normal EGD. Recommendation:           1. Patient has a contact number available for                            emergencies. The signs and symptoms of potential  delayed complications were discussed with the                            patient. Return to normal activities tomorrow.                            Written discharge instructions were provided to the                            patient.                           2. Resume previous diet.                           3. Reflux precautions                           4. Continue present medications.                           5. Resume pantoprazole 40 mg daily. Once daily                            therapy is not effective, then increase to                            pantoprazole 40 mg twice daily. If you are having                            trouble obtaining your medication due to insurance                            related issues, please reach out to my office nurse                            for assistance with this problem Wilhemina BonitoJohn N. Marina GoodellPerry, MD 12/30/2021 10:14:11 AM This report has been signed electronically.

## 2021-12-30 NOTE — Progress Notes (Signed)
HISTORY OF PRESENT ILLNESS:   Nicole [name] is a 56 y.o. female with morbid obesity, diabetes mellitus, hypertension, sleep apnea, and fibromyalgia.  She presents today regarding worsening reflux symptoms, constipation, and persistent left upper quadrant pain.  The patient was initially evaluated in this office October 13, 2019 regarding chronic GERD, intermittent solid food dysphagia, chronic constipation, and left upper quadrant pain.  See that dictation for details.  She was placed on pantoprazole 40 mg twice daily, regular MiraLAX, and scheduled for barium swallow.  Barium swallow on October 14, 2019 revealed a nonspecific esophageal motility disorder, possible esophagitis, small hiatal hernia, and a benign-appearing distal esophageal stricture with with impaction of 13 mm barium tablet.  Upper endoscopy suggested.  Patient was provided these results, told to continue her PPI, and follow-up in the office in 6 weeks.  She did not.  She tells me that her symptoms improved on medical therapies.  She tells me that she ran out of her PPI.  Since that time she has had worsening reflux symptoms.  Some dysphagia.  Significant regurgitation.  She also continues to complain of left upper quadrant discomfort.  She states this is worse.  She has lost weight but she attributes this to Ozempic.  She no longer takes hydrocodone but does take Celebrex.  She also complains of constipation with a bowel movement most days but may go 3 days without a bowel movement.  She takes MiraLAX somewhat regularly.  No bleeding.  She has had colonoscopy elsewhere shortly before the pandemic.  Review of blood work from October 2000 22nd reveals unremarkable comprehensive metabolic panel except for elevated glucose.   REVIEW OF SYSTEMS:   All non-GI ROS negative unless otherwise stated in the HPI except for sinus and allergy trouble, anxiety, arthritis, back pain, visual change, fatigue, headaches, muscle cramps, night sweats, sleeping  problems, ankle swelling       Past Medical History:  Diagnosis Date   Depression     Diabetes mellitus     Fibromyalgia     GERD (gastroesophageal reflux disease)     HTN (hypertension)     Obesity     RLS (restless legs syndrome)     Sleep apnea     Vitamin D deficiency     Vitamin D deficiency             Past Surgical History:  Procedure Laterality Date   CATARACT EXTRACTION       CESAREAN SECTION       TUBAL LIGATION          Social History Nicole [name]  reports that she has never smoked. She has never used smokeless tobacco. She reports that she does not drink alcohol and does not use drugs.   family history includes Arthritis in her mother; Asthma in an other family member; Cancer in an other family member; Diabetes in an other family member; Heart disease in an other family member.       Allergies  Allergen Reactions   Metformin            PHYSICAL EXAMINATION: Vital signs: BP (!) 144/82   Pulse 95   Ht 5\' 1"  (1.549 m)   Wt 239 lb (108.4 kg)   LMP 04/07/2016   SpO2 98%   BMI 45.16 kg/m   Constitutional: Unhealthy appearing, obese, no acute distress Psychiatric: alert and oriented x3, cooperative.  Pleasant Eyes: extraocular movements intact, anicteric, conjunctiva pink Mouth: oral pharynx moist, no lesions Neck:  supple no lymphadenopathy Cardiovascular: heart regular rate and rhythm, no murmur Lungs: clear to auscultation bilaterally Abdomen: soft, obese, nontender but uncomfortable with palpation in the upper abdomen, nondistended, no obvious ascites, no peritoneal signs, normal bowel sounds, no organomegaly Rectal: Omitted Extremities: no clubbing, or cyanosis.  Trace lower extremity edema bilaterally Skin: no lesions on visible extremities Neuro: No focal deficits.  Cranial nerves intact   ASSESSMENT:   1.  Significant GERD symptoms off PPI 2.  Dysphagia likely due to peptic stricture.  Abnormal esophagram as previously described. 3.   Chronic persistent worsening left upper quadrant pain.  Previously felt secondary to obesity.  Will evaluate further given ongoing and worsening nature. 4.  Chronic constipation 5.  Multiple medical problems including morbid obesity with current BMI 45     PLAN:   1.  Reflux precautions 2.  Ongoing weight loss 3.  Prescribe pantoprazole 40 mg twice daily.  Medication risk reviewed 4.  Recommend MiraLAX.  2 doses daily.  Adjust as needed to have daily bowel movements 5.  Schedule upper endoscopy to evaluate GERD, dysphagia, and upper abdominal pain.  Possible esophageal dilation.  Patient is high risk given her comorbidities and body habitus.The nature of the procedure, as well as the risks, benefits, and alternatives were carefully and thoroughly reviewed with the patient. Ample time for discussion and questions allowed. The patient understood, was satisfied, and agreed to proceed.  6.  Schedule CT scan of the abdomen pelvis to further evaluate worsening upper abdominal pain.  No interval change in history or exam.  CT scan was negative.  Now for upper endoscopy with possible esophageal dilation for GERD, dysphagia, and abdominal pain

## 2021-12-30 NOTE — Patient Instructions (Signed)
Impression/Recommendations:  GERD, hiatal hernia, and esophagitis handouts given to patient.  Resume previous diet. Implement Reflux precautions. Continue present medications.  Resume Pantoprazole 40 mg. Daily. If once daily is not effective, then increase to Pantoprazole 40 mg. 2 times daily.  If you have trouble obtaining your medication due to insurance related issues, please contact my office nurse for assistance with this.  YOU HAD AN ENDOSCOPIC PROCEDURE TODAY AT THE [facility]:   Refer to the procedure report that was given to you for any specific questions about what was found during the examination.  If the procedure report does not answer your questions, please call your gastroenterologist to clarify.  If you requested that your care partner not be given the details of your procedure findings, then the procedure report has been included in a sealed envelope for you to review at your convenience later.  YOU SHOULD EXPECT: Some feelings of bloating in the abdomen. Passage of more gas than usual.  Walking can help get rid of the air that was put into your GI tract during the procedure and reduce the bloating. If you had a lower endoscopy (such as a colonoscopy or flexible sigmoidoscopy) you may notice spotting of blood in your stool or on the toilet paper. If you underwent a bowel prep for your procedure, you may not have a normal bowel movement for a few days.  Please Note:  You might notice some irritation and congestion in your nose or some drainage.  This is from the oxygen used during your procedure.  There is no need for concern and it should clear up in a day or so.  SYMPTOMS TO REPORT IMMEDIATELY: Following upper endoscopy (EGD)  Vomiting of blood or coffee ground material  New chest pain or pain under the shoulder blades  Painful or persistently difficult swallowing  New shortness of breath  Fever of 100F or higher  Black, tarry-looking stools  For urgent or  emergent issues, a gastroenterologist can be reached at any hour by calling [phone]. Do not use MyChart messaging for urgent concerns.    DIET:  We do recommend a small meal at first, but then you may proceed to your regular diet.  Drink plenty of fluids but you should avoid alcoholic beverages for 24 hours.  ACTIVITY:  You should plan to take it easy for the rest of today and you should NOT DRIVE or use heavy machinery until tomorrow (because of the sedation medicines used during the test).    FOLLOW UP: Our staff will call the number listed on your records 24-72 hours following your procedure to check on you and address any questions or concerns that you may have regarding the information given to you following your procedure. If we do not reach you, we will leave a message.  We will attempt to reach you two times.  During this call, we will ask if you have developed any symptoms of COVID 19. If you develop any symptoms (ie: fever, flu-like symptoms, shortness of breath, cough etc.) before then, please call [phone].  If you test positive for Covid 19 in the 2 weeks post procedure, please call and report this information to [place].    If any biopsies were taken you will be contacted by phone or by letter within the next 1-3 weeks.  Please call us at [phone] if you have not heard about the biopsies in 3 weeks.    SIGNATURES/CONFIDENTIALITY: You and/or your care partner have signed  paperwork which will be entered into your electronic medical record.  These signatures attest to the fact that that the information above on your After Visit Summary has been reviewed and is understood.  Full responsibility of the confidentiality of this discharge information lies with you and/or your care-partner.

## 2021-12-31 ENCOUNTER — Telehealth: Payer: Self-pay | Admitting: *Deleted

## 2021-12-31 NOTE — Telephone Encounter (Signed)
  Follow up Call-     12/30/2021    9:29 AM  Call back number  Post procedure Call Back phone  # [phone]  Permission to leave phone message Yes     Patient questions:  Do you have a fever, pain , or abdominal swelling? No. Pain Score  0 *  Have you tolerated food without any problems? Yes.    Have you been able to return to your normal activities? Yes.    Do you have any questions about your discharge instructions: Diet   No. Medications  No. Follow up visit  No.  Do you have questions or concerns about your Care? No.  Actions: * If pain score is 4 or above: No action needed, pain <4.

## 2022-01-07 ENCOUNTER — Telehealth: Payer: Self-pay | Admitting: Internal Medicine

## 2022-01-07 ENCOUNTER — Other Ambulatory Visit (HOSPITAL_COMMUNITY): Payer: Self-pay

## 2022-01-07 NOTE — Telephone Encounter (Signed)
Patient called stating her insurance won't pay for her pantoprazole stating that the dosage is too high; however, it is the dosage the doctor wants her to be on.  She will probably need a prior authorization.  Please call patient and advise.  Thank you.

## 2022-01-07 NOTE — Telephone Encounter (Signed)
I'm sure this needs a PA because it is twice a day

## 2022-01-08 MED ORDER — PANTOPRAZOLE SODIUM 40 MG PO TBEC: 40.0000 mg | DELAYED_RELEASE_TABLET | Freq: Two times a day (BID) | ORAL | 3 refills | Status: DC

## 2022-01-08 NOTE — Telephone Encounter (Signed)
90 supply of Pantoprazole sent to [facility]

## 2022-01-10 DIAGNOSIS — G4733 Obstructive sleep apnea (adult) (pediatric): Secondary | ICD-10-CM | POA: Diagnosis not present

## 2022-02-05 ENCOUNTER — Telehealth: Payer: Self-pay

## 2022-02-05 NOTE — Telephone Encounter (Signed)
Does pt need labs before next OV. I see Amylase and Lipase order with no results but unsure if these were due to the pt not feeling well.

## 2022-02-09 DIAGNOSIS — G4733 Obstructive sleep apnea (adult) (pediatric): Secondary | ICD-10-CM | POA: Diagnosis not present

## 2022-02-12 DIAGNOSIS — G4733 Obstructive sleep apnea (adult) (pediatric): Secondary | ICD-10-CM | POA: Diagnosis not present

## 2022-02-24 DIAGNOSIS — R112 Nausea with vomiting, unspecified: Secondary | ICD-10-CM | POA: Diagnosis not present

## 2022-02-24 DIAGNOSIS — R1084 Generalized abdominal pain: Secondary | ICD-10-CM | POA: Diagnosis not present

## 2022-02-25 LAB — AMYLASE: Amylase: 55 U/L (ref 31–110)

## 2022-02-25 LAB — LIPASE: Lipase: 26 U/L (ref 14–72)

## 2022-02-27 ENCOUNTER — Encounter: Payer: Self-pay | Admitting: Nurse Practitioner

## 2022-02-27 ENCOUNTER — Ambulatory Visit: Payer: BC Managed Care – PPO | Admitting: Nurse Practitioner

## 2022-02-27 VITALS — BP 120/82 | HR 87 | Ht 61.0 in | Wt 248.0 lb

## 2022-02-27 DIAGNOSIS — I10 Essential (primary) hypertension: Secondary | ICD-10-CM

## 2022-02-27 DIAGNOSIS — E1165 Type 2 diabetes mellitus with hyperglycemia: Secondary | ICD-10-CM

## 2022-02-27 DIAGNOSIS — Z794 Long term (current) use of insulin: Secondary | ICD-10-CM

## 2022-02-27 DIAGNOSIS — E782 Mixed hyperlipidemia: Secondary | ICD-10-CM | POA: Diagnosis not present

## 2022-02-27 LAB — POCT GLYCOSYLATED HEMOGLOBIN (HGB A1C): HbA1c POC (<> result, manual entry): 12 % (ref 4.0–5.6)

## 2022-02-27 LAB — POCT UA - MICROALBUMIN
Albumin/Creatinine Ratio, Urine, POC: <30
Creatinine, POC: 200 mg/dL
Microalbumin Ur, POC: 30 mg/L

## 2022-02-27 MED ORDER — TRESIBA FLEXTOUCH 200 UNIT/ML ~~LOC~~ SOPN
60.0000 [IU] | PEN_INJECTOR | Freq: Every evening | SUBCUTANEOUS | 3 refills | Status: DC
Start: 2022-02-27 — End: 2022-06-02

## 2022-02-27 MED ORDER — INSULIN LISPRO (1 UNIT DIAL) 100 UNIT/ML (KWIKPEN): 20.0000 [IU] | PEN_INJECTOR | Freq: Three times a day (TID) | SUBCUTANEOUS | 3 refills | Status: DC

## 2022-02-27 MED ORDER — TIRZEPATIDE 5 MG/0.5ML ~~LOC~~ SOAJ: 5.0000 mg | SUBCUTANEOUS | 3 refills | Status: DC

## 2022-02-27 NOTE — Progress Notes (Signed)
Endocrinology Follow Up Note       02/27/2022, 11:46 AM   Subjective:    Patient ID: Nicole [name], female    DOB: [birth year].  Nicole [name] is being seen in follow up after being seen in consultation for management of currently uncontrolled symptomatic diabetes requested by  Sherrilee Gilles, DO.   Past Medical History:  Diagnosis Date   Depression    Diabetes mellitus    Fibromyalgia    GERD (gastroesophageal reflux disease)    HTN (hypertension)    Obesity    RLS (restless legs syndrome)    Sleep apnea    Vitamin D deficiency    Vitamin D deficiency     Past Surgical History:  Procedure Laterality Date   CATARACT EXTRACTION     CESAREAN SECTION     TUBAL LIGATION      Social History   Socioeconomic History   Marital status: Married    Spouse name: Not on file   Number of children: Not on file   Years of education: MBA   Highest education level: Not on file  Occupational History   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  Tobacco Use   Smoking status: Never   Smokeless tobacco: Never  Vaping Use   Vaping Use: Never used  Substance and Sexual Activity   Alcohol use: No   Drug use: No   Sexual activity: Not on file  Other Topics Concern   Not on file  Social History Narrative   Not on file   Social Determinants of Health   Financial Resource Strain: Not on file  Food Insecurity: Not on file  Transportation Needs: Not on file  Physical Activity: Not on file  Stress: Not on file  Social Connections: Not on file    Family History  Problem Relation Age of Onset   Arthritis Mother    Heart disease Other    Cancer Other    Asthma Other    Diabetes Other    Rectal cancer Neg Hx    Esophageal cancer Neg Hx    Stomach cancer Neg Hx     Outpatient Encounter Medications as of 02/27/2022  Medication Sig   tirzepatide (MOUNJARO) 5 MG/0.5ML Pen Inject 5 mg into the  skin once a week.   aspirin EC 81 MG tablet Take 81 mg by mouth daily.   Blood Glucose Monitoring Suppl (ACCU-CHEK GUIDE) w/Device KIT 1 Piece by Does not apply route as directed.   celecoxib (CELEBREX) 200 MG capsule Take 200 mg by mouth daily.   Cholecalciferol 125 MCG (5000 UT) TABS Take 5,000 Units by mouth daily.   Continuous Blood Gluc Sensor (FREESTYLE LIBRE 14 DAY SENSOR) MISC 1 each by Does not apply route every 14 (fourteen) days. Change sensor every 14 days   cyclobenzaprine (FLEXERIL) 5 MG tablet Take 5 mg by mouth 3 (three) times daily as needed.   Docusate Calcium (STOOL SOFTENER PO) Take 2 tablets by mouth daily.   DULoxetine (CYMBALTA) 60 MG capsule Take 60 mg by mouth daily.   glucose blood (ACCU-CHEK GUIDE) test strip Use as instructed   hydrochlorothiazide (MICROZIDE) 12.5 MG capsule Take  12.5 mg by mouth daily.   HYDROcodone-acetaminophen (NORCO/VICODIN) 5-325 MG tablet Take 2 tablets by mouth 2 (two) times daily. (Patient not taking: Reported on 12/30/2021)   insulin degludec (TRESIBA FLEXTOUCH) 200 UNIT/ML FlexTouch Pen Inject 60 Units into the skin at bedtime.   insulin lispro (HUMALOG KWIKPEN) 100 UNIT/ML KwikPen Inject 20-26 Units into the skin 3 (three) times daily.   Insulin Pen Needle (B-D ULTRAFINE III SHORT PEN) 31G X 8 MM MISC 1 each by Does not apply route as directed. Use to inject insulin 4 times daily   losartan (COZAAR) 100 MG tablet Take 100 mg by mouth daily.   mupirocin ointment (BACTROBAN) 2 % Apply topically 2 (two) times daily.   pantoprazole (PROTONIX) 40 MG tablet Take 1 tablet (40 mg total) by mouth 2 (two) times daily.   polyethylene glycol powder (GLYCOLAX/MIRALAX) 17 GM/SCOOP powder Take 17 g by mouth daily.   pregabalin (LYRICA) 100 MG capsule Take 100 mg by mouth 2 (two) times daily.   RESTASIS MULTIDOSE 0.05 % ophthalmic emulsion SMARTSIG:In Eye(s)   [DISCONTINUED] amoxicillin (AMOXIL) 500 MG tablet take 1 tablet by oral route every 8 hours for  dental infection   [DISCONTINUED] insulin degludec (TRESIBA FLEXTOUCH) 200 UNIT/ML FlexTouch Pen Inject 60 Units into the skin at bedtime.   [DISCONTINUED] insulin lispro (HUMALOG KWIKPEN) 100 UNIT/ML KwikPen Inject 20-26 Units into the skin 3 (three) times daily.   [DISCONTINUED] Semaglutide,0.25 or 0.5MG/DOS, (OZEMPIC, 0.25 OR 0.5 MG/DOSE,) 2 MG/1.5ML SOPN Inject 0.5 mg into the skin once a week. (Patient not taking: Reported on 02/27/2022)   Facility-Administered Encounter Medications as of 02/27/2022  Medication   0.9 %  sodium chloride infusion    ALLERGIES: Allergies  Allergen Reactions   Metformin     VACCINATION STATUS: Immunization History  Administered Date(s) Administered   Moderna Sars-Covid-2 Vaccination 11/23/2019, 12/21/2019    Diabetes She presents for her follow-up diabetic visit. She has type 2 diabetes mellitus. Onset time: Diagnosed at approx age of 27. Her disease course has been worsening. There are no hypoglycemic associated symptoms. Associated symptoms include fatigue, polydipsia and polyuria. Pertinent negatives for diabetes include no blurred vision, no polyphagia and no weight loss. There are no hypoglycemic complications. Symptoms are stable. Diabetic complications include nephropathy. Risk factors for coronary artery disease include diabetes mellitus, dyslipidemia, family history, obesity, hypertension, sedentary lifestyle and stress. Current diabetic treatment includes intensive insulin program. She is compliant with treatment most of the time. Her weight is increasing rapidly. She is following a generally unhealthy diet. When asked about meal planning, she reported none. She has not had a previous visit with a dietitian. She rarely participates in exercise. Her home blood glucose trend is fluctuating dramatically. Her overall blood glucose range is >200 mg/dl. (She presents today with her CGM showing limited data (active time only 34%) showing gross hyperglycemia  overall.  Her POCT A1c today is 12%, worsening from previous visit.  Analysis of her CGM shows TIR 6%, TAR 94%, TBR 0%.  She reports she has not been on the Ozempic since April, says her pharmacy was on backorder for it and took it off her list.) An ACE inhibitor/angiotensin II receptor blocker is being taken. She does not see a podiatrist.Eye exam is current.  Hyperlipidemia This is a chronic problem. The current episode started more than 1 year ago. The problem is controlled. Recent lipid tests were reviewed and are normal. Exacerbating diseases include chronic renal disease, diabetes and obesity. Factors aggravating her hyperlipidemia include  thiazides. She is currently on no antihyperlipidemic treatment. Compliance problems include adherence to diet, adherence to exercise and psychosocial issues.  Risk factors for coronary artery disease include diabetes mellitus, dyslipidemia, hypertension, obesity, a sedentary lifestyle, stress and family history.  Hypertension This is a chronic problem. The current episode started more than 1 year ago. The problem has been gradually improving since onset. The problem is controlled. Pertinent negatives include no blurred vision. There are no associated agents to hypertension. Risk factors for coronary artery disease include diabetes mellitus, dyslipidemia, family history, obesity, sedentary lifestyle and stress. Past treatments include angiotensin blockers and diuretics. The current treatment provides moderate improvement. Compliance problems include diet and exercise.  Hypertensive end-organ damage includes kidney disease. Identifiable causes of hypertension include chronic renal disease.     Review of systems  Constitutional: + steadily increasing body weight, current Body mass index is 46.86 kg/m., + fatigue, no subjective hyperthermia Eyes: no blurry vision, no xerophthalmia, reports chronic dry eyes ENT: no sore throat, no nodules palpated in throat, no  dysphagia/odynophagia, no hoarseness Cardiovascular: no chest pain, no shortness of breath, no palpitations, no leg swelling Respiratory: no cough, no shortness of breath Gastrointestinal: no nausea/vomiting/diarrhea Musculoskeletal: + generalized aches/pains- hx of fibromyalgia Skin: no rashes, no hyperemia Neurological: no tremors, + numbness and tingling to B feet and right arm into fingers, no dizziness Psychiatric: + depression and anxiety- reports multiple social stressors  Objective:     BP 120/82   Pulse 87   Ht '5\' 1"'  (1.549 m)   Wt 248 lb (112.5 kg)   LMP 04/07/2016   BMI 46.86 kg/m   Wt Readings from Last 3 Encounters:  02/27/22 248 lb (112.5 kg)  12/30/21 239 lb (108.4 kg)  11/28/21 239 lb (108.4 kg)     BP Readings from Last 3 Encounters:  02/27/22 120/82  12/30/21 122/86  11/28/21 (!) 144/82      Physical Exam- Limited  Constitutional:  Body mass index is 46.86 kg/m. , not in acute distress, normal state of mind Eyes:  EOMI, no exophthalmos Neck: Supple Cardiovascular: RRR, no murmurs, rubs, or gallops, no edema Respiratory: Adequate breathing efforts, no crackles, rales, rhonchi, or wheezing Musculoskeletal: no gross deformities, strength intact in all four extremities, no gross restriction of joint movements Skin:  no rashes, no hyperemia Neurological: no tremor with outstretched hands   Diabetic Foot Exam - Simple   No data filed     CMP ( most recent) CMP     Component Value Date/Time   NA 137 12/19/2021 1045   NA 140 05/23/2021 0940   K 3.7 12/19/2021 1045   CL 102 12/19/2021 1045   CO2 28 12/19/2021 1045   GLUCOSE 291 (H) 12/19/2021 1045   BUN 12 12/19/2021 1045   BUN 17 05/23/2021 0940   CREATININE 0.97 12/19/2021 1045   CALCIUM 9.6 12/19/2021 1045   PROT 7.1 05/23/2021 0940   ALBUMIN 4.2 05/23/2021 0940   AST 12 05/23/2021 0940   ALT 13 05/23/2021 0940   ALKPHOS 133 (H) 05/23/2021 0940   BILITOT 0.4 05/23/2021 0940   GFRAA 68  12/25/2020 0000     Diabetic Labs (most recent): Lab Results  Component Value Date   HGBA1C 12.0 02/27/2022   HGBA1C 11.2 (A) 08/16/2021   HGBA1C 11.4 (A) 04/19/2021   MICROALBUR 30 02/27/2022   MICROALBUR 10 02/18/2021     Lipid Panel ( most recent) Lipid Panel     Component Value Date/Time   CHOL 157  05/23/2021 0940   TRIG 109 05/23/2021 0940   HDL 50 05/23/2021 0940   CHOLHDL 3.1 05/23/2021 0940   LDLCALC 87 05/23/2021 0940   LABVLDL 20 05/23/2021 0940      Lab Results  Component Value Date   TSH 3.120 05/23/2021   FREET4 1.12 05/23/2021           Assessment & Plan:   1) Uncontrolled type 2 diabetes mellitus with hyperglycemia ([place])  She presents today with her CGM showing limited data (active time only 34%) showing gross hyperglycemia overall.  Her POCT A1c today is 12%, worsening from previous visit.  Analysis of her CGM shows TIR 6%, TAR 94%, TBR 0%.  She reports she has not been on the Ozempic since April, says her pharmacy was on backorder for it and took it off her list.  - YESLI VANDERHOFF has currently uncontrolled symptomatic type 2 DM since 56 years of age.  -Recent labs reviewed.  - I had a long discussion with her about the progressive nature of diabetes and the pathology behind its complications. -her diabetes is complicated by CKD stage 2, retinopathy, neuropathy and she remains at a high risk for more acute and chronic complications which include CAD, CVA, CKD, retinopathy, and neuropathy. These are all discussed in detail with her.  - Nutritional counseling repeated at each appointment due to patients tendency to fall back in to old habits.  - The patient admits there is a room for improvement in their diet and drink choices. -  Suggestion is made for the patient to avoid simple carbohydrates from their diet including Cakes, Sweet Desserts / Pastries, Ice Cream, Soda (diet and regular), Sweet Tea, Candies, Chips, Cookies, Sweet Pastries, Store  Bought Juices, Alcohol in Excess of 1-2 drinks a day, Artificial Sweeteners, Coffee Creamer, and "Sugar-free" Products. This will help patient to have stable blood glucose profile and potentially avoid unintended weight gain.   - I encouraged the patient to switch to unprocessed or minimally processed complex starch and increased protein intake (animal or plant source), fruits, and vegetables.   - Patient is advised to stick to a routine mealtimes to eat 3 meals a day and avoid unnecessary snacks (to snack only to correct hypoglycemia).  - I have approached her with the following individualized plan to manage her diabetes and patient agrees:   -She is advised to continue Tresiba 60 units SQ nightly and continue her Humalog 22-28 units TID with meals if glucose is above 90 and she is eating (Specific instructions on how to titrate insulin dosage based on glucose readings given to patient in writing).  I initiated Mounjaro 2.5 mg SQ weekly x 4 doses (sample provided), then if tolerated well she can increase to 5 mg SQ weekly thereafter.   -she is encouraged to continue monitoring glucose 4 times daily (using her CGM), before meals and before bed, and to call the clinic if she has readings less than 70 or above 300 for 3 tests in a row.  - she is warned not to take insulin without proper monitoring per orders. - Adjustment parameters are given to her for hypo and hyperglycemia in writing.  - Specific targets for  A1c; LDL, HDL, and Triglycerides were discussed with the patient.  2) Blood Pressure /Hypertension:  her blood pressure is controlled to target.   she is advised to continue her current medications including Losartan 100 mg p.o. daily with breakfast, and HCTZ 12.5 mg po daily.  3) Lipids/Hyperlipidemia:  Review of her recent lipid panel from 05/23/21 showed controlled LDL at 87. She is advised to avoid fried foods and butter.  4)  Weight/Diet:  her Body mass index is 46.86 kg/m.  -   clearly complicating her diabetes care.   she is a candidate for weight loss. I discussed with her the fact that loss of 5 - 10% of her  current body weight will have the most impact on her diabetes management.  Exercise, and detailed carbohydrates information provided  -  detailed on discharge instructions.  5) Chronic Care/Health Maintenance: -she is on ACEI/ARB and is encouraged to initiate and continue to follow up with Ophthalmology, Dentist, Podiatrist at least yearly or according to recommendations, and advised to stay away from smoking. I have recommended yearly flu vaccine and pneumonia vaccine at least every 5 years; moderate intensity exercise for up to 150 minutes weekly; and sleep for at least 7 hours a day.  - she is advised to maintain close follow up with Sherrilee Gilles, DO for primary care needs, as well as her other providers for optimal and coordinated care.      I spent 46 minutes in the care of the patient today including review of labs from [place], Lipids, Thyroid Function, Hematology (current and previous including abstractions from other facilities); face-to-face time discussing  her blood glucose readings/logs, discussing hypoglycemia and hyperglycemia episodes and symptoms, medications doses, her options of short and long term treatment based on the latest standards of care / guidelines;  discussion about incorporating lifestyle medicine;  and documenting the encounter. Risk reduction counseling performed per USPSTF guidelines to reduce obesity and cardiovascular risk factors.     Please refer to Patient Instructions for Blood Glucose Monitoring and Insulin/Medications Dosing Guide"  in media tab for additional information. Please  also refer to " Patient Self Inventory" in the Media  tab for reviewed elements of pertinent patient history.  Nicole [name] participated in the discussions, expressed understanding, and voiced agreement with the above plans.  All questions were  answered to her satisfaction. she is encouraged to contact clinic should she have any questions or concerns prior to her return visit.     Follow up plan: - Return in about 3 months (around 05/30/2022) for Diabetes F/U with A1c in office, No previsit labs, Bring meter and logs.    Rayetta Pigg, [facility] [address] [place], [place] [address] Phone: [phone] Fax: [phone]  02/27/2022, 11:46 AM

## 2022-03-14 DIAGNOSIS — G4733 Obstructive sleep apnea (adult) (pediatric): Secondary | ICD-10-CM | POA: Diagnosis not present

## 2022-03-28 IMAGING — MG MM DIGITAL SCREENING BILAT W/ TOMO AND CAD
6 of 10 series · 6 of 30 positions shown · non-contrast
Comparison: Previous exam(s).

CLINICAL DATA: Screening.

EXAM:
DIGITAL SCREENING BILATERAL MAMMOGRAM WITH TOMOSYNTHESIS AND CAD
TECHNIQUE: Bilateral screening digital craniocaudal and mediolateral oblique
mammograms were obtained. Bilateral screening digital breast
tomosynthesis was performed. The images were evaluated with
computer-aided detection.

[R MLO synth-2D]
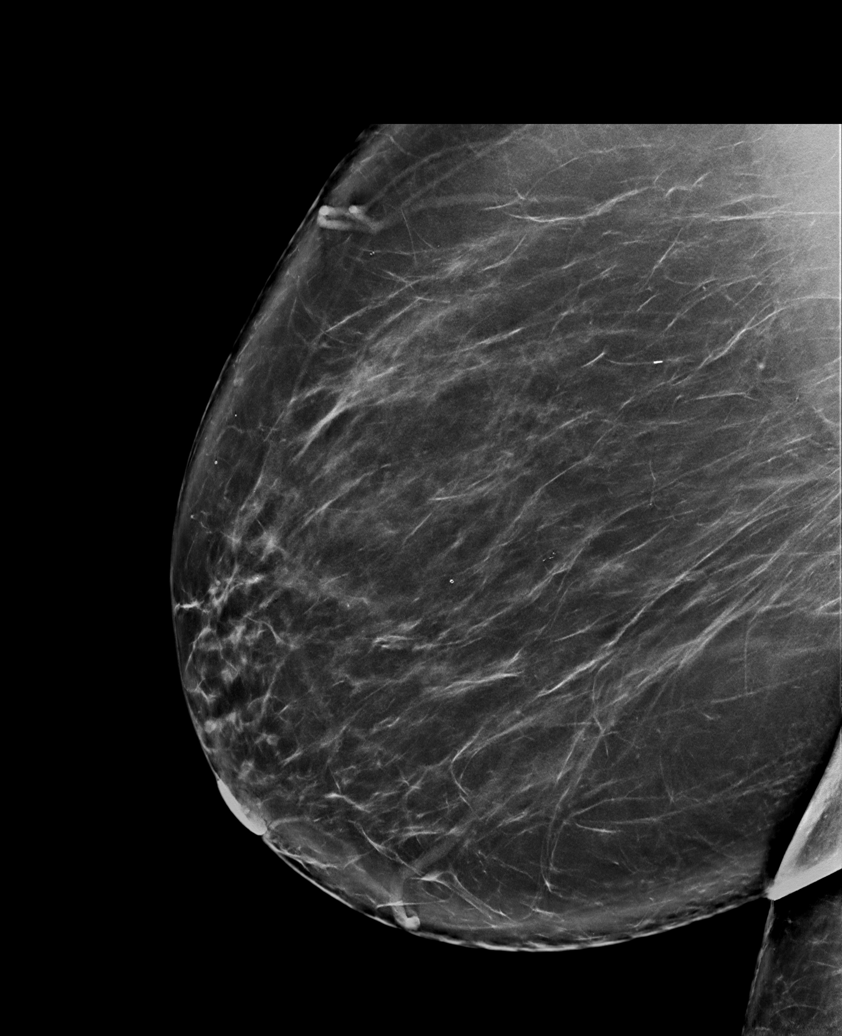

[R CV synth-2D]
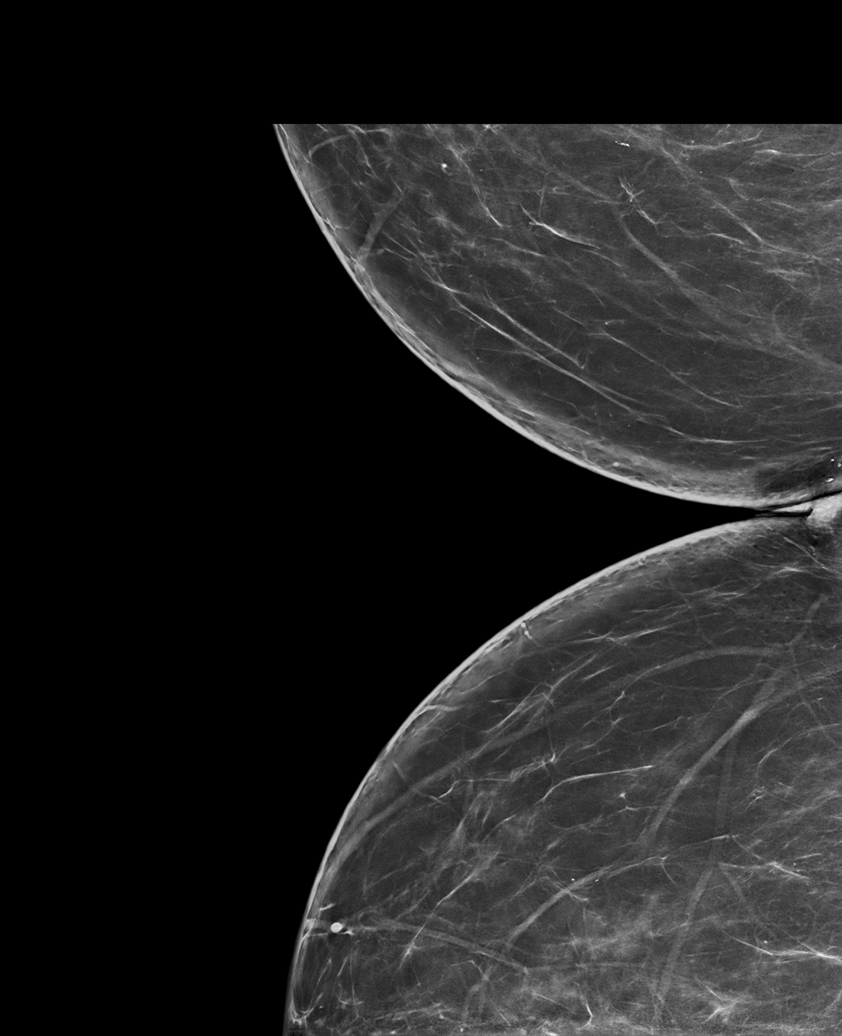

[L CC synth-2D]
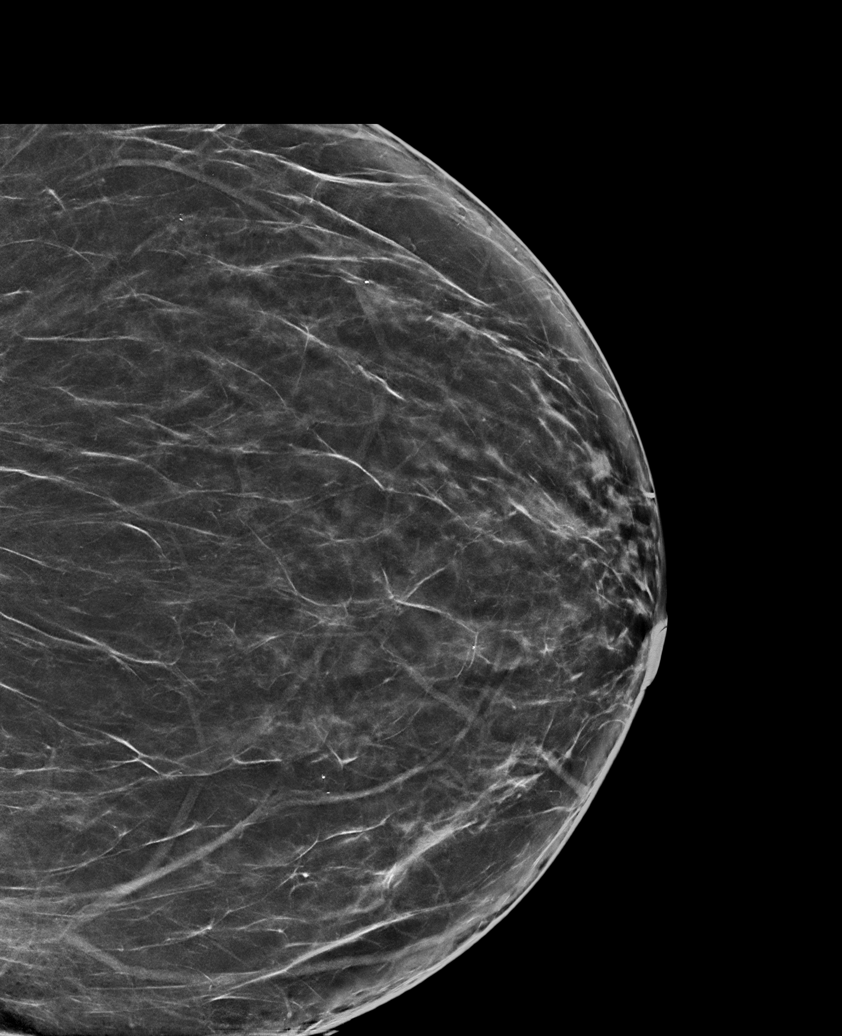

[R CC synth-2D]
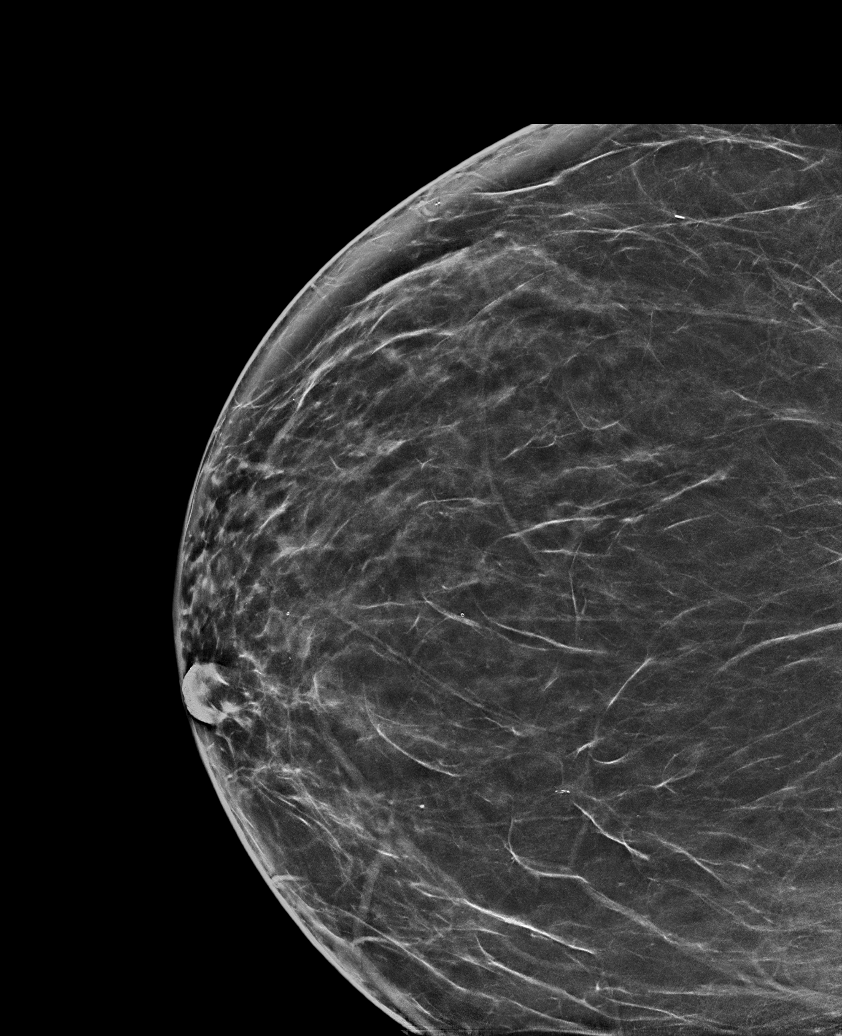

[L MLO synth-2D]
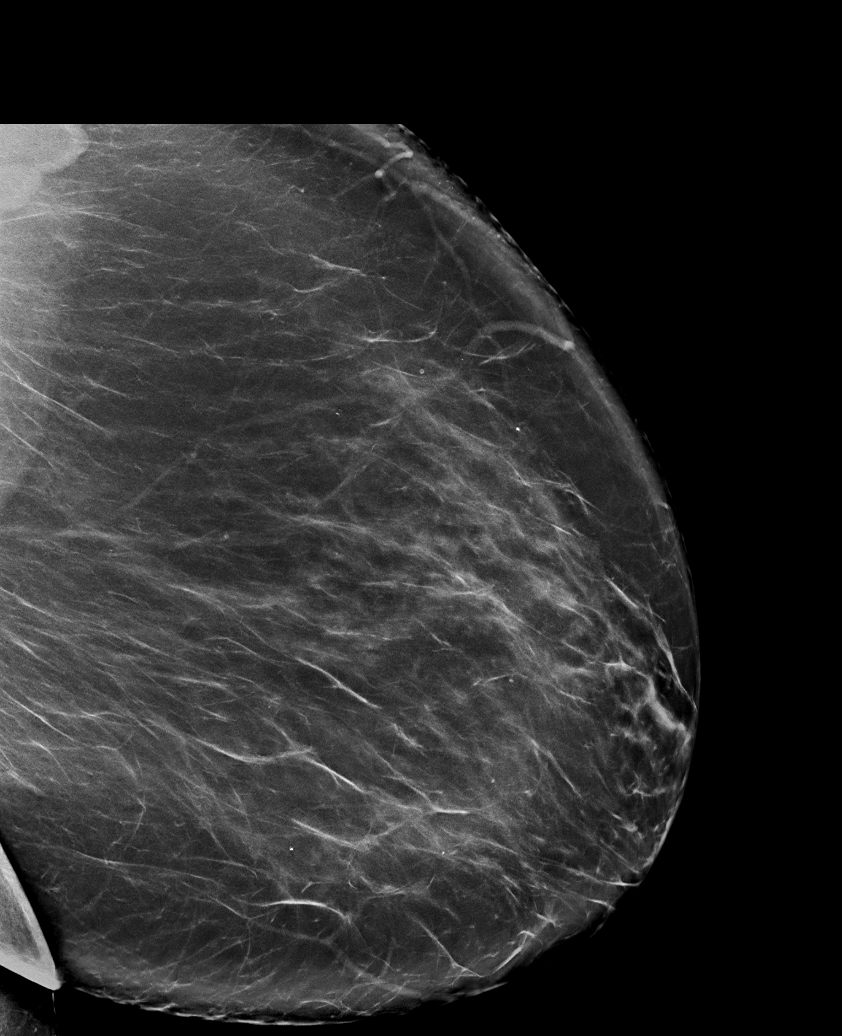

[R MLO tomo · tomo slice 55/110.0]
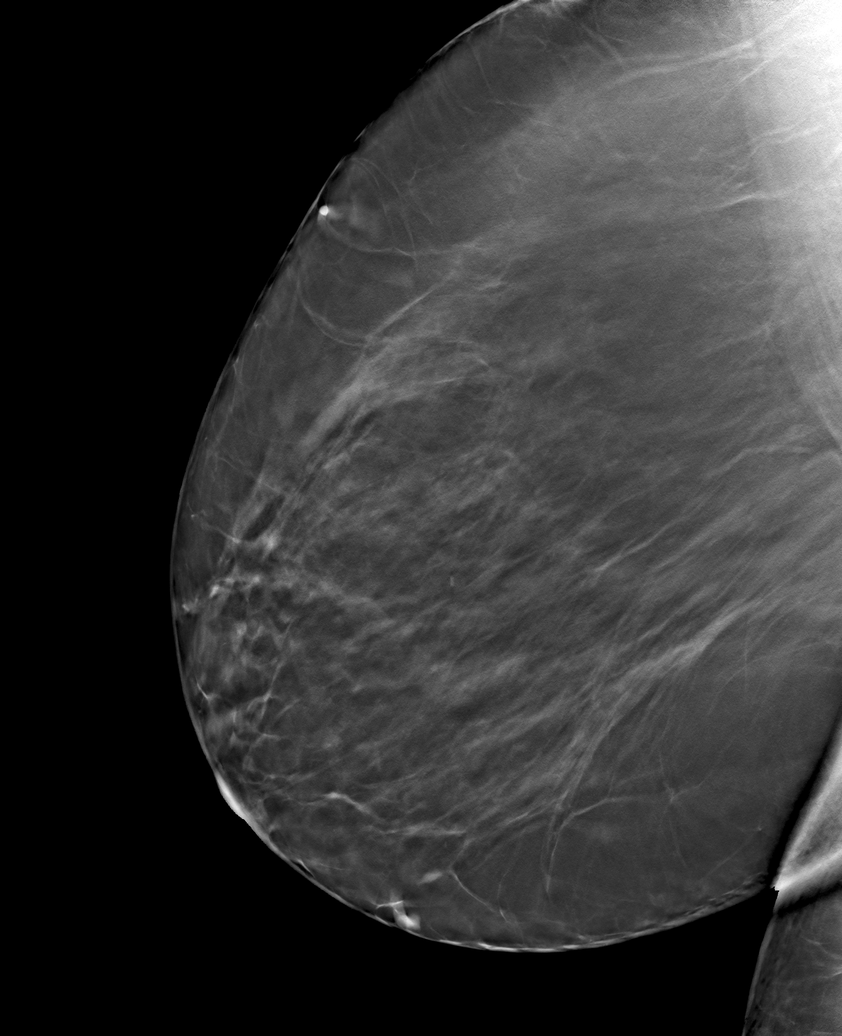

[6 of 30 positions shown; findings below may reference images not displayed]

ACR Breast Density Category b: There are scattered areas of
fibroglandular density.
FINDINGS: There are no findings suspicious for malignancy.
IMPRESSION: No mammographic evidence of malignancy. A result letter of this
screening mammogram will be mailed directly to the patient.

RECOMMENDATION:
Screening mammogram in one year. (Code:51-O-LD2)

BI-RADS CATEGORY  1: Negative.

## 2022-04-12 DIAGNOSIS — G4733 Obstructive sleep apnea (adult) (pediatric): Secondary | ICD-10-CM | POA: Diagnosis not present

## 2022-05-28 ENCOUNTER — Other Ambulatory Visit: Payer: Self-pay | Admitting: Nurse Practitioner

## 2022-05-28 DIAGNOSIS — E1165 Type 2 diabetes mellitus with hyperglycemia: Secondary | ICD-10-CM

## 2022-05-29 DIAGNOSIS — G4733 Obstructive sleep apnea (adult) (pediatric): Secondary | ICD-10-CM | POA: Diagnosis not present

## 2022-06-02 ENCOUNTER — Ambulatory Visit: Payer: BC Managed Care – PPO | Admitting: Nurse Practitioner

## 2022-06-02 ENCOUNTER — Encounter: Payer: Self-pay | Admitting: Nurse Practitioner

## 2022-06-02 VITALS — BP 151/87 | HR 101 | Ht 61.0 in | Wt 253.2 lb

## 2022-06-02 DIAGNOSIS — I10 Essential (primary) hypertension: Secondary | ICD-10-CM

## 2022-06-02 DIAGNOSIS — E782 Mixed hyperlipidemia: Secondary | ICD-10-CM

## 2022-06-02 DIAGNOSIS — E1165 Type 2 diabetes mellitus with hyperglycemia: Secondary | ICD-10-CM

## 2022-06-02 DIAGNOSIS — Z794 Long term (current) use of insulin: Secondary | ICD-10-CM

## 2022-06-02 LAB — POCT GLYCOSYLATED HEMOGLOBIN (HGB A1C): Hemoglobin A1C: 11.2 % — AB (ref 4.0–5.6)

## 2022-06-02 MED ORDER — FREESTYLE LIBRE 14 DAY SENSOR MISC: 2 refills | Status: DC

## 2022-06-02 MED ORDER — TIRZEPATIDE 5 MG/0.5ML ~~LOC~~ SOAJ: 5.0000 mg | SUBCUTANEOUS | 3 refills | Status: DC

## 2022-06-02 MED ORDER — INSULIN LISPRO (1 UNIT DIAL) 100 UNIT/ML (KWIKPEN): 20.0000 [IU] | PEN_INJECTOR | Freq: Three times a day (TID) | SUBCUTANEOUS | 3 refills | Status: DC

## 2022-06-02 MED ORDER — TRESIBA FLEXTOUCH 200 UNIT/ML ~~LOC~~ SOPN: 60.0000 [IU] | PEN_INJECTOR | Freq: Every evening | SUBCUTANEOUS | 3 refills | Status: DC

## 2022-06-02 NOTE — Progress Notes (Signed)
Endocrinology Follow Up Note       06/02/2022, 10:04 AM   Subjective:    Patient ID: Nicole [name], female    DOB: 13-Jul-1966.  Nicole [name] is being seen in follow up after being seen in consultation for management of currently uncontrolled symptomatic diabetes requested by  Sherrilee Gilles, DO.   Past Medical History:  Diagnosis Date   Depression    Diabetes mellitus    Fibromyalgia    GERD (gastroesophageal reflux disease)    HTN (hypertension)    Obesity    RLS (restless legs syndrome)    Sleep apnea    Vitamin D deficiency    Vitamin D deficiency     Past Surgical History:  Procedure Laterality Date   CATARACT EXTRACTION     CESAREAN SECTION     TUBAL LIGATION      Social History   Socioeconomic History   Marital status: Married    Spouse name: Not on file   Number of children: Not on file   Years of education: MBA   Highest education level: Not on file  Occupational History   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  Tobacco Use   Smoking status: Never   Smokeless tobacco: Never  Vaping Use   Vaping Use: Never used  Substance and Sexual Activity   Alcohol use: No   Drug use: No   Sexual activity: Not on file  Other Topics Concern   Not on file  Social History Narrative   Not on file   Social Determinants of Health   Financial Resource Strain: Not on file  Food Insecurity: Not on file  Transportation Needs: Not on file  Physical Activity: Not on file  Stress: Not on file  Social Connections: Not on file    Family History  Problem Relation Age of Onset   Arthritis Mother    Heart disease Other    Cancer Other    Asthma Other    Diabetes Other    Rectal cancer Neg Hx    Esophageal cancer Neg Hx    Stomach cancer Neg Hx     Outpatient Encounter Medications as of 06/02/2022  Medication Sig   aspirin EC 81 MG tablet Take 81 mg by mouth daily.    Blood Glucose Monitoring Suppl (ACCU-CHEK GUIDE) w/Device KIT 1 Piece by Does not apply route as directed.   celecoxib (CELEBREX) 200 MG capsule Take 200 mg by mouth daily.   Cholecalciferol 125 MCG (5000 UT) TABS Take 5,000 Units by mouth daily.   cyclobenzaprine (FLEXERIL) 5 MG tablet Take 5 mg by mouth 3 (three) times daily as needed.   Docusate Calcium (STOOL SOFTENER PO) Take 2 tablets by mouth daily.   DULoxetine (CYMBALTA) 60 MG capsule Take 60 mg by mouth daily.   glucose blood (ACCU-CHEK GUIDE) test strip Use as instructed   hydrochlorothiazide (MICROZIDE) 12.5 MG capsule Take 12.5 mg by mouth daily.   Insulin Pen Needle (B-D ULTRAFINE III SHORT PEN) 31G X 8 MM MISC 1 each by Does not apply route as directed. Use to inject insulin 4 times daily   losartan (COZAAR) 100 MG tablet Take  100 mg by mouth daily.   mupirocin ointment (BACTROBAN) 2 % Apply topically 2 (two) times daily.   pantoprazole (PROTONIX) 40 MG tablet Take 1 tablet (40 mg total) by mouth 2 (two) times daily.   polyethylene glycol powder (GLYCOLAX/MIRALAX) 17 GM/SCOOP powder Take 17 g by mouth daily.   pregabalin (LYRICA) 100 MG capsule Take 100 mg by mouth 2 (two) times daily.   RESTASIS MULTIDOSE 0.05 % ophthalmic emulsion SMARTSIG:In Eye(s)   [DISCONTINUED] Continuous Blood Gluc Sensor (FREESTYLE LIBRE 14 DAY SENSOR) MISC APPLY 1 SENSOR EVERY 14 DAYS   [DISCONTINUED] insulin degludec (TRESIBA FLEXTOUCH) 200 UNIT/ML FlexTouch Pen Inject 60 Units into the skin at bedtime.   [DISCONTINUED] insulin lispro (HUMALOG KWIKPEN) 100 UNIT/ML KwikPen Inject 20-26 Units into the skin 3 (three) times daily.   [DISCONTINUED] tirzepatide [facility]) 5 MG/0.5ML Pen Inject 5 mg into the skin once a week.   Continuous Blood Gluc Sensor (FREESTYLE LIBRE 14 DAY SENSOR) MISC Change sensor every 14 days   HYDROcodone-acetaminophen (NORCO/VICODIN) 5-325 MG tablet Take 2 tablets by mouth 2 (two) times daily. (Patient not taking: Reported on  12/30/2021)   insulin degludec (TRESIBA FLEXTOUCH) 200 UNIT/ML FlexTouch Pen Inject 60 Units into the skin at bedtime.   insulin lispro (HUMALOG KWIKPEN) 100 UNIT/ML KwikPen Inject 20-26 Units into the skin 3 (three) times daily.   tirzepatide [facility]) 5 MG/0.5ML Pen Inject 5 mg into the skin once a week.   Facility-Administered Encounter Medications as of 06/02/2022  Medication   0.9 %  sodium chloride infusion    ALLERGIES: Allergies  Allergen Reactions   Metformin     VACCINATION STATUS: Immunization History  Administered Date(s) Administered   Moderna Sars-Covid-2 Vaccination 11/23/2019, 12/21/2019    Diabetes She presents for her follow-up diabetic visit. She has type 2 diabetes mellitus. Onset time: Diagnosed at approx age of 89. Her disease course has been fluctuating. Hypoglycemia symptoms include dizziness, nervousness/anxiousness and tremors. Associated symptoms include fatigue, foot paresthesias, polydipsia and polyuria. Pertinent negatives for diabetes include no blurred vision, no polyphagia and no weight loss. There are no hypoglycemic complications. Symptoms are stable. Diabetic complications include nephropathy. Risk factors for coronary artery disease include diabetes mellitus, dyslipidemia, family history, obesity, hypertension, sedentary lifestyle and stress. Current diabetic treatment includes intensive insulin program (and Mounjaro). She is compliant with treatment some of the time (has had trouble getting her medications from [place] consistently). Her weight is increasing steadily. She is following a generally unhealthy diet. When asked about meal planning, she reported none. She has not had a previous visit with a dietitian. She rarely participates in exercise. Her home blood glucose trend is fluctuating dramatically. Her overall blood glucose range is >200 mg/dl. (She presents today with her CGM showing limited data (active time only 50%) showing TIR 23%, TAR 77%, TBR  0% with a GMI of 9.3%.  Her POCT  A1c today is 11.2%, improving slightly from last visit of 12%.  She reports she has not taken her medications consistently due to troubles getting her medications from [facility], she has often stretched her prescription due to availability.  She is very frustrated with this.) An ACE inhibitor/angiotensin II receptor blocker is being taken. She does not see a podiatrist.Eye exam is current.  Hyperlipidemia This is a chronic problem. The current episode started more than 1 year ago. The problem is controlled. Recent lipid tests were reviewed and are normal. Exacerbating diseases include chronic renal disease, diabetes and obesity. Factors aggravating her hyperlipidemia include thiazides. She  is currently on no antihyperlipidemic treatment. Compliance problems include adherence to diet, adherence to exercise and psychosocial issues.  Risk factors for coronary artery disease include diabetes mellitus, dyslipidemia, hypertension, obesity, a sedentary lifestyle, stress and family history.  Hypertension This is a chronic problem. The current episode started more than 1 year ago. The problem has been gradually improving since onset. The problem is controlled. Pertinent negatives include no blurred vision. There are no associated agents to hypertension. Risk factors for coronary artery disease include diabetes mellitus, dyslipidemia, family history, obesity, sedentary lifestyle and stress. Past treatments include angiotensin blockers and diuretics. The current treatment provides moderate improvement. Compliance problems include diet and exercise.  Hypertensive end-organ damage includes kidney disease. Identifiable causes of hypertension include chronic renal disease.     Review of systems  Constitutional: + steadily increasing body weight, current Body mass index is 47.84 kg/m., + fatigue, no subjective hyperthermia Eyes: no blurry vision, no xerophthalmia, reports chronic dry  eyes ENT: no sore throat, no nodules palpated in throat, no dysphagia/odynophagia, no hoarseness Cardiovascular: no chest pain, no shortness of breath, no palpitations, no leg swelling Respiratory: no cough, no shortness of breath Gastrointestinal: no nausea/vomiting/diarrhea Musculoskeletal: + generalized aches/pains- hx of fibromyalgia Skin: no rashes, no hyperemia Neurological: no tremors, + numbness and tingling to B feet (worse on left great toe) and right arm into fingers, no dizziness Psychiatric: + depression and anxiety- reports multiple social stressors  Objective:     BP (!) 151/87 (BP Location: Right Arm, Patient Position: Sitting, Cuff Size: Large) Comment: Patient states that she has not taken BP medication this morning.  Pulse (!) 101   Ht _0  (1.549 m)   Wt 253 lb 3.2 oz (114.9 kg)   LMP 04/07/2016   BMI 47.84 kg/m   Wt Readings from Last 3 Encounters:  06/02/22 253 lb 3.2 oz (114.9 kg)  02/27/22 248 lb (112.5 kg)  12/30/21 239 lb (108.4 kg)     BP Readings from Last 3 Encounters:  06/02/22 (!) 151/87  02/27/22 120/82  12/30/21 122/86      Physical Exam- Limited  Constitutional:  Body mass index is 47.84 kg/m. , not in acute distress, normal state of mind Eyes:  EOMI, no exophthalmos Neck: Supple Cardiovascular: RRR, no murmurs, rubs, or gallops, no edema Respiratory: Adequate breathing efforts, no crackles, rales, rhonchi, or wheezing Musculoskeletal: no gross deformities, strength intact in all four extremities, no gross restriction of joint movements Skin:  no rashes, no hyperemia Neurological: no tremor with outstretched hands   Diabetic Foot Exam - Simple   Simple Foot Form Diabetic Foot exam was performed with the following findings: Yes 06/02/2022  9:50 AM  Visual Inspection No deformities, no ulcerations, no other skin breakdown bilaterally: Yes Sensation Testing Intact to touch and monofilament testing bilaterally: Yes Pulse  Check Posterior Tibialis and Dorsalis pulse intact bilaterally: Yes Comments     CMP ( most recent) CMP     Component Value Date/Time   NA 137 12/19/2021 1045   NA 140 05/23/2021 0940   K 3.7 12/19/2021 1045   CL 102 12/19/2021 1045   CO2 28 12/19/2021 1045   GLUCOSE 291 (H) 12/19/2021 1045   BUN 12 12/19/2021 1045   BUN 17 05/23/2021 0940   CREATININE 0.97 12/19/2021 1045   CALCIUM 9.6 12/19/2021 1045   PROT 7.1 05/23/2021 0940   ALBUMIN 4.2 05/23/2021 0940   AST 12 05/23/2021 0940   ALT 13 05/23/2021 0940   ALKPHOS 133 (  H) 05/23/2021 0940   BILITOT 0.4 05/23/2021 0940   GFRAA 68 12/25/2020 0000     Diabetic Labs (most recent): Lab Results  Component Value Date   HGBA1C 11.2 (A) 06/02/2022   HGBA1C 12.0 02/27/2022   HGBA1C 11.2 (A) 08/16/2021   MICROALBUR 30 02/27/2022   MICROALBUR 10 02/18/2021     Lipid Panel ( most recent) Lipid Panel     Component Value Date/Time   CHOL 157 05/23/2021 0940   TRIG 109 05/23/2021 0940   HDL 50 05/23/2021 0940   CHOLHDL 3.1 05/23/2021 0940   LDLCALC 87 05/23/2021 0940   LABVLDL 20 05/23/2021 0940      Lab Results  Component Value Date   TSH 3.120 05/23/2021   FREET4 1.12 05/23/2021           Assessment & Plan:   1) Uncontrolled type 2 diabetes mellitus with hyperglycemia ([place])  She presents today with her CGM showing limited data (active time only 50%) showing TIR 23%, TAR 77%, TBR 0% with a GMI of 9.3%.  Her POCT  A1c today is 11.2%, improving slightly from last visit of 12%.  She reports she has not taken her medications consistently due to troubles getting her medications from [facility], she has often stretched her prescription due to availability.  She is very frustrated with this.  - Nicole [name] has currently uncontrolled symptomatic type 2 DM since 56 years of age.  -Recent labs reviewed.  - I had a long discussion with her about the progressive nature of diabetes and the pathology behind its  complications. -her diabetes is complicated by CKD stage 2, retinopathy, neuropathy and she remains at a high risk for more acute and chronic complications which include CAD, CVA, CKD, retinopathy, and neuropathy. These are all discussed in detail with her.  - Nutritional counseling repeated at each appointment due to patients tendency to fall back in to old habits.  - The patient admits there is a room for improvement in their diet and drink choices. -  Suggestion is made for the patient to avoid simple carbohydrates from their diet including Cakes, Sweet Desserts / Pastries, Ice Cream, Soda (diet and regular), Sweet Tea, Candies, Chips, Cookies, Sweet Pastries, Store Bought Juices, Alcohol in Excess of 1-2 drinks a day, Artificial Sweeteners, Coffee Creamer, and "Sugar-free" Products. This will help patient to have stable blood glucose profile and potentially avoid unintended weight gain.   - I encouraged the patient to switch to unprocessed or minimally processed complex starch and increased protein intake (animal or plant source), fruits, and vegetables.   - Patient is advised to stick to a routine mealtimes to eat 3 meals a day and avoid unnecessary snacks (to snack only to correct hypoglycemia).  - I have approached her with the following individualized plan to manage her diabetes and patient agrees:   -She is advised to continue Tresiba 60 units SQ nightly and continue her Humalog 22-28 units TID with meals if glucose is above 90 and she is eating (Specific instructions on how to titrate insulin dosage based on glucose readings given to patient in writing), and Mounjaro 5 mg SQ weekly.   I did send all her diabetes prescriptions to [facility] which will hopefully correct the availability problem.   -she is encouraged to continue monitoring glucose 4 times daily (using her CGM), before meals and before bed, and to call the clinic if she has readings less than 70 or above 300 for 3 tests in  a  row.  - she is warned not to take insulin without proper monitoring per orders. - Adjustment parameters are given to her for hypo and hyperglycemia in writing.  - Specific targets for  A1c; LDL, HDL, and Triglycerides were discussed with the patient.  2) Blood Pressure /Hypertension:  her blood pressure is controlled to target.   she is advised to continue her current medications including Losartan 100 mg p.o. daily with breakfast, and HCTZ 12.5 mg po daily.  3) Lipids/Hyperlipidemia:    Review of her recent lipid panel from 05/23/21 showed controlled LDL at 87. She is advised to avoid fried foods and butter.  4)  Weight/Diet:  her Body mass index is 47.84 kg/m.  -  clearly complicating her diabetes care.   she is a candidate for weight loss. I discussed with her the fact that loss of 5 - 10% of her  current body weight will have the most impact on her diabetes management.  Exercise, and detailed carbohydrates information provided  -  detailed on discharge instructions.  5) Chronic Care/Health Maintenance: -she is on ACEI/ARB and is encouraged to initiate and continue to follow up with Ophthalmology, Dentist, Podiatrist at least yearly or according to recommendations, and advised to stay away from smoking. I have recommended yearly flu vaccine and pneumonia vaccine at least every 5 years; moderate intensity exercise for up to 150 minutes weekly; and sleep for at least 7 hours a day.  - she is advised to maintain close follow up with Sherrilee Gilles, DO for primary care needs, as well as her other providers for optimal and coordinated care.      I spent 48 minutes in the care of the patient today including review of labs from [place], Lipids, Thyroid Function, Hematology (current and previous including abstractions from other facilities); face-to-face time discussing  her blood glucose readings/logs, discussing hypoglycemia and hyperglycemia episodes and symptoms, medications doses, her options of  short and long term treatment based on the latest standards of care / guidelines;  discussion about incorporating lifestyle medicine;  and documenting the encounter. Risk reduction counseling performed per USPSTF guidelines to reduce obesity and cardiovascular risk factors.     Please refer to Patient Instructions for Blood Glucose Monitoring and Insulin/Medications Dosing Guide"  in media tab for additional information. Please  also refer to " Patient Self Inventory" in the Media  tab for reviewed elements of pertinent patient history.  Nicole [name] participated in the discussions, expressed understanding, and voiced agreement with the above plans.  All questions were answered to her satisfaction. she is encouraged to contact clinic should she have any questions or concerns prior to her return visit.     Follow up plan: - Return in about 3 months (around 09/02/2022) for No previsit labs, Diabetes F/U with A1c in office, Bring meter and logs.   Rayetta Pigg, Ku [facility] [address] [place], [place] [address] Phone: [phone] Fax: [phone]  06/02/2022, 10:04 AM

## 2022-06-04 ENCOUNTER — Other Ambulatory Visit: Payer: Self-pay

## 2022-06-04 MED ORDER — TOUJEO SOLOSTAR 300 UNIT/ML ~~LOC~~ SOPN: 60.0000 [IU] | PEN_INJECTOR | Freq: Every day | SUBCUTANEOUS | 0 refills | Status: DC

## 2022-07-04 ENCOUNTER — Encounter: Payer: Self-pay | Admitting: *Deleted

## 2022-07-11 ENCOUNTER — Encounter: Payer: Self-pay | Admitting: Gastroenterology

## 2022-07-11 ENCOUNTER — Ambulatory Visit: Payer: BC Managed Care – PPO | Admitting: Gastroenterology

## 2022-07-11 VITALS — BP 120/80 | HR 94 | Ht <= 58 in | Wt 249.0 lb

## 2022-07-11 DIAGNOSIS — R1011 Right upper quadrant pain: Secondary | ICD-10-CM | POA: Diagnosis not present

## 2022-07-11 DIAGNOSIS — K219 Gastro-esophageal reflux disease without esophagitis: Secondary | ICD-10-CM | POA: Diagnosis not present

## 2022-07-11 DIAGNOSIS — K5909 Other constipation: Secondary | ICD-10-CM | POA: Diagnosis not present

## 2022-07-11 MED ORDER — LINACLOTIDE 145 MCG PO CAPS: 145.0000 ug | ORAL_CAPSULE | Freq: Every day | ORAL | 2 refills | Status: DC

## 2022-07-11 MED ORDER — DEXLANSOPRAZOLE 60 MG PO CPDR: 60.0000 mg | DELAYED_RELEASE_CAPSULE | Freq: Every day | ORAL | 2 refills | Status: DC

## 2022-07-11 MED ORDER — SUCRALFATE 1 G PO TABS: ORAL_TABLET | ORAL | 1 refills | Status: DC

## 2022-07-11 MED ORDER — LINACLOTIDE 145 MCG PO CAPS: 145.0000 ug | ORAL_CAPSULE | Freq: Every day | ORAL | 0 refills | Status: AC

## 2022-07-11 NOTE — Progress Notes (Signed)
07/11/2022 LIA VIGILANTE [id number] February 19, 1966   HISTORY OF PRESENT ILLNESS: This is a 56 year old female is a patient Dr. Blanch Media.  She has past medical history of depression, diabetes, fibromyalgia, GERD, hypertension, obesity, RLS, sleep apnea.  She is currently on pantoprazole 40 mg twice daily for her reflux symptoms and does not feel like that is working.  She went to urgent care recently and they gave her Carafate tablets to take.  She is using MiraLAX once or twice daily for constipation and she was also recently given Dulcolax tablets to use.  She tells me that her stomach hurts all over, but a lot of times more so on the right side (RUQ).  She says she does not have an appetite and does not really want food.  Says sometimes the smell of food makes her nauseous.  She has episodes of vomiting in the mornings.  She has had constipation since she was a very young child.  She had CT scan of the abdomen and pelvis with contrast in June 2023 that showed only a small hiatal hernia.  Of note, hemoglobin A1c is 11.2.  EGD 12/2021:  GERD with mild esophagitis and a small hiatal hernia.  Colonoscopy with Dr. Posey Pronto in [place], [place] before the Covid pandemic.   Past Medical History:  Diagnosis Date   Depression    Diabetes mellitus    Fibromyalgia    GERD (gastroesophageal reflux disease)    Hiatal hernia    HTN (hypertension)    Obesity    RLS (restless legs syndrome)    Sleep apnea    Vitamin D deficiency    Vitamin D deficiency    Past Surgical History:  Procedure Laterality Date   CATARACT EXTRACTION     CESAREAN SECTION     TUBAL LIGATION      reports that she has never smoked. She has never used smokeless tobacco. She reports that she does not drink alcohol and does not use drugs. family history includes Arthritis in her mother; Asthma in an other family member; Cancer in an other family member; Diabetes in an other family member; Heart disease in an other family  member. Allergies  Allergen Reactions   Metformin       Outpatient Encounter Medications as of 07/11/2022  Medication Sig   aspirin EC 81 MG tablet Take 81 mg by mouth daily.   Blood Glucose Monitoring Suppl (ACCU-CHEK GUIDE) w/Device KIT 1 Piece by Does not apply route as directed.   celecoxib (CELEBREX) 200 MG capsule Take 200 mg by mouth daily.   Cholecalciferol 125 MCG (5000 UT) TABS Take 5,000 Units by mouth daily.   Continuous Blood Gluc Sensor (FREESTYLE LIBRE 14 DAY SENSOR) MISC Change sensor every 14 days   cyclobenzaprine (FLEXERIL) 5 MG tablet Take 5 mg by mouth 3 (three) times daily as needed.   Docusate Calcium (STOOL SOFTENER PO) Take 2 tablets by mouth daily.   DULoxetine (CYMBALTA) 60 MG capsule Take 60 mg by mouth daily.   glucose blood (ACCU-CHEK GUIDE) test strip Use as instructed   hydrochlorothiazide (MICROZIDE) 12.5 MG capsule Take 12.5 mg by mouth daily.   HYDROcodone-acetaminophen (NORCO/VICODIN) 5-325 MG tablet Take 2 tablets by mouth 2 (two) times daily.   insulin lispro (HUMALOG KWIKPEN) 100 UNIT/ML KwikPen Inject 20-26 Units into the skin 3 (three) times daily.   Insulin Pen Needle (B-D ULTRAFINE III SHORT PEN) 31G X 8 MM MISC 1 each by Does not apply route as directed.  Use to inject insulin 4 times daily   losartan (COZAAR) 100 MG tablet Take 100 mg by mouth daily.   mupirocin ointment (BACTROBAN) 2 % Apply topically 2 (two) times daily.   pantoprazole (PROTONIX) 40 MG tablet Take 1 tablet (40 mg total) by mouth 2 (two) times daily.   polyethylene glycol powder (GLYCOLAX/MIRALAX) 17 GM/SCOOP powder Take 17 g by mouth daily.   pregabalin (LYRICA) 100 MG capsule Take 100 mg by mouth 2 (two) times daily.   RESTASIS MULTIDOSE 0.05 % ophthalmic emulsion SMARTSIG:In Eye(s)   sucralfate (CARAFATE) 1 g tablet Take 1 tablet 4 times a day by oral route for 7 days.   tirzepatide [facility]) 5 MG/0.5ML Pen Inject 5 mg into the skin once a week.   insulin glargine, 1 Unit  Dial, (TOUJEO SOLOSTAR) 300 UNIT/ML Solostar Pen Inject 60 Units into the skin at bedtime. (Patient not taking: Reported on 07/11/2022)   TRESIBA FLEXTOUCH 100 UNIT/ML FlexTouch Pen ADMINISTER 70 UNITS UNDER THE SKIN AT BEDTIME   Facility-Administered Encounter Medications as of 07/11/2022  Medication   0.9 %  sodium chloride infusion     REVIEW OF SYSTEMS  : All other systems reviewed and negative except where noted in the History of Present Illness.   PHYSICAL EXAM: BP 120/80   Pulse 94   Ht _0  (1.448 m)   Wt 249 lb (112.9 kg)   LMP 04/07/2016   SpO2 97%   BMI 53.88 kg/m  General: Well developed AA female in no acute distress Head: Normocephalic and atraumatic Eyes:  Sclerae anicteric, conjunctiva pink. Ears: Normal auditory acuity Lungs: Clear throughout to auscultation; no W/R/R. Heart: Regular rate and rhythm; no M/R/G. Abdomen: Soft, non-distended.  BS present.  Some diffuse TTP but > in the RUQ. Musculoskeletal: Symmetrical with no gross deformities  Skin: No lesions on visible extremities Extremities: No edema  Neurological: Alert oriented x 4, grossly non-focal Psychological:  Alert and cooperative. Normal mood and affect  ASSESSMENT AND PLAN: *GERD: Currently on pantoprazole 40 mg twice daily.  I am going to have her discontinue that and start Dexilant 60 mg daily.  Will also have her do Carafate tablets made into a suspension before meals and at bedtime for the next month.  Prescriptions sent to pharmacy. She has uncontrolled diabetes with a recent hemoglobin A1c of 11.2.  May have a component of gastroparesis as well.  *Right-sided abdominal pain: Will check HIDA scan with [facility].   *Constipation: Reports constipation since she was very young.  Likely has CIC with a possible component of IBS-C.  Will try Linzess 145 mcg daily to start.  Samples given and prescription sent.  Can titrate up or down if needed.  She will discontinue the MiraLAX for now.  **We will have  her sign for her previous colonoscopy records from Dr. Posey Pronto.  **Follow-up with me in 4-6 weeks.  CC:  Sherrilee Gilles, DO

## 2022-07-11 NOTE — Patient Instructions (Addendum)
Discontinue pantoprazole, miralax  We have sent the following medications to your pharmacy for you to pick up at your convenience:   Start Dexilant 60 mg take 1 capsule daily Linzess 145 mcg take 1 capsule daily  Carafate 120 1 tablet before meal and bedtime 10 ml into warm water to make a slurry   Please call to schedule a follow visit in 4-6 weeks with Doug Sou PA-C  You have been scheduled for a HIDA scan at [facility] (1st floor) on 08/01/22. Please arrive 30 minutes prior to your scheduled appointment at  8:30 am. Make certain not to have anything to eat or drink at least 6 hours prior to your test. Should this appointment date or time not work well for you, please call radiology scheduling at [phone].  _____________________________________________________________________ hepatobiliary (HIDA) scan is an imaging procedure used to diagnose problems in the liver, gallbladder and bile ducts. In the HIDA scan, a radioactive chemical or tracer is injected into a vein in your arm. The tracer is handled by the liver like bile. Bile is a fluid produced and excreted by your liver that helps your digestive system break down fats in the foods you eat. Bile is stored in your gallbladder and the gallbladder releases the bile when you eat a meal. A special nuclear medicine scanner (gamma camera) tracks the flow of the tracer from your liver into your gallbladder and small intestine.  During your HIDA scan  You'll be asked to change into a hospital gown before your HIDA scan begins. Your health care team will position you on a table, usually on your back. The radioactive tracer is then injected into a vein in your arm.The tracer travels through your bloodstream to your liver, where it's taken up by the bile-producing cells. The radioactive tracer travels with the bile from your liver into your gallbladder and through your bile ducts to your small intestine.You may feel some pressure while the  radioactive tracer is injected into your vein. As you lie on the table, a special gamma camera is positioned over your abdomen taking pictures of the tracer as it moves through your body. The gamma camera takes pictures continually for about an hour. You'll need to keep still during the HIDA scan. This can become uncomfortable, but you may find that you can lessen the discomfort by taking deep breaths and thinking about other things. Tell your health care team if you're uncomfortable. The radiologist will watch on a computer the progress of the radioactive tracer through your body. The HIDA scan may be stopped when the radioactive tracer is seen in the gallbladder and enters your small intestine. This typically takes about an hour. In some cases extra imaging will be performed if original images aren't satisfactory, if morphine is given to help visualize the gallbladder or if the medication CCK is given to look at the contraction of the gallbladder. This test typically takes 2 hours to complete. ________________________________________________________________________ _______________________________________________________  If you are age 25 or older, your body mass index should be between 23-30. Your Body mass index is 53.88 kg/m. If this is out of the aforementioned range listed, please consider follow up with your Primary Care Provider.  If you are age 61 or younger, your body mass index should be between 19-25. Your Body mass index is 53.88 kg/m. If this is out of the aformentioned range listed, please consider follow up with your Primary Care Provider.   ________________________________________________________  The [place] [facility] providers would like  to encourage you to use [facility] to communicate with providers for non-urgent requests or questions.  Due to long hold times on the telephone, sending your provider a message by [facility] may be a faster and more efficient way to get a response.  Please allow 48  business hours for a response.  Please remember that this is for non-urgent requests.   Due to recent changes in healthcare laws, you may see the results of your imaging and laboratory studies on MyChart before your provider has had a chance to review them.  We understand that in some cases there may be results that are confusing or concerning to you. Not all laboratory results come back in the same time frame and the provider may be waiting for multiple results in order to interpret others.  Please give [place] 48 hours in order for your provider to thoroughly review all the results before contacting the office for clarification of your results.    Thank you for entrusting me with your care and choosing [facility].  Doug Sou PA-C

## 2022-07-12 DIAGNOSIS — G4733 Obstructive sleep apnea (adult) (pediatric): Secondary | ICD-10-CM | POA: Diagnosis not present

## 2022-07-12 NOTE — Progress Notes (Signed)
Noted  

## 2022-07-15 NOTE — Progress Notes (Signed)
9 pages of records received from [facility]. These records have been placed on Doug Sou, PA-C desk for review.

## 2022-08-01 ENCOUNTER — Encounter (HOSPITAL_COMMUNITY): Admission: RE | Admit: 2022-08-01 | Payer: BC Managed Care – PPO | Source: Ambulatory Visit

## 2022-08-07 ENCOUNTER — Encounter (HOSPITAL_COMMUNITY)
Admission: RE | Admit: 2022-08-07 | Discharge: 2022-08-07 | Disposition: A | Discharge status: Home / Self Care | Payer: BC Managed Care – PPO | Source: Ambulatory Visit | Attending: Gastroenterology | Admitting: Gastroenterology

## 2022-08-07 DIAGNOSIS — R1011 Right upper quadrant pain: Secondary | ICD-10-CM

## 2022-08-07 DIAGNOSIS — K219 Gastro-esophageal reflux disease without esophagitis: Secondary | ICD-10-CM | POA: Diagnosis not present

## 2022-08-07 DIAGNOSIS — K5909 Other constipation: Secondary | ICD-10-CM | POA: Diagnosis not present

## 2022-08-07 DIAGNOSIS — R1010 Upper abdominal pain, unspecified: Secondary | ICD-10-CM | POA: Diagnosis not present

## 2022-08-07 MED ORDER — TECHNETIUM TC 99M MEBROFENIN IV KIT
5.4000 | PACK | Freq: Once | INTRAVENOUS | Status: AC | PRN
  Administered 2022-08-07: 5.4 via INTRAVENOUS

## 2022-08-15 ENCOUNTER — Ambulatory Visit: Payer: BC Managed Care – PPO | Admitting: Gastroenterology

## 2022-08-15 ENCOUNTER — Encounter: Payer: Self-pay | Admitting: Gastroenterology

## 2022-08-15 VITALS — BP 138/83 | HR 99 | Ht <= 58 in | Wt 250.0 lb

## 2022-08-15 DIAGNOSIS — R112 Nausea with vomiting, unspecified: Secondary | ICD-10-CM | POA: Diagnosis not present

## 2022-08-15 DIAGNOSIS — K219 Gastro-esophageal reflux disease without esophagitis: Secondary | ICD-10-CM

## 2022-08-15 DIAGNOSIS — K5900 Constipation, unspecified: Secondary | ICD-10-CM | POA: Diagnosis not present

## 2022-08-15 NOTE — Progress Notes (Signed)
08/15/2022 Nicole [name] [id number] 11-18-1965   HISTORY OF PRESENT ILLNESS:  This is a 57 year old female who is a patient Dr. Blanch Media.  She has past medical history of depression, uncontrolled diabetes, fibromyalgia, GERD, hypertension, obesity, RLS, sleep apnea.  She is here today for follow-up of her constipation, reflux, abdominal pain.  At her last visit we started Linzess 145 mcg daily for constipation.  She says that she has been taking that and that is working well for her.  The upper GI symptoms though have been making her very miserable.  She is having a lot of reflux.  She has nausea and vomits every morning, sometimes undigested food.  Has a lot of pain in her upper abdomen.  We were going to discontinue her pantoprazole that she was on twice daily and try Dexilant 60 mg daily instead, but she says that the pharmacy never told her the prescription was there so she discontinued the pantoprazole and never was able to start the [place].  She is on the Carafate, but says that that is not been doing anything for her.  Last Hgb A1C was 11.2.  EGD 12/2021:  GERD with mild esophagitis and a small hiatal hernia.   Received records from her previous GI in [place], [place]. EGD and colonoscopy were both performed June 2020. EGD was normal. Colonoscopy also normal. Duodenal biopsies showed mild chronic inflammation of the second part of the duodenum with unremarkable villi.   Past Medical History:  Diagnosis Date   Depression    Diabetes mellitus    Fibromyalgia    GERD (gastroesophageal reflux disease)    Hiatal hernia    HTN (hypertension)    Obesity    RLS (restless legs syndrome)    Sleep apnea    Vitamin D deficiency    Vitamin D deficiency    Past Surgical History:  Procedure Laterality Date   CATARACT EXTRACTION     CESAREAN SECTION     TUBAL LIGATION      reports that she has never smoked. She has never used smokeless tobacco. She reports that she does not drink  alcohol and does not use drugs. family history includes Arthritis in her mother; Asthma in an other family member; Cancer in an other family member; Diabetes in an other family member; Heart disease in an other family member. Allergies  Allergen Reactions   Metformin       Outpatient Encounter Medications as of 08/15/2022  Medication Sig   aspirin EC 81 MG tablet Take 81 mg by mouth daily.   Blood Glucose Monitoring Suppl (ACCU-CHEK GUIDE) w/Device KIT 1 Piece by Does not apply route as directed.   celecoxib (CELEBREX) 200 MG capsule Take 200 mg by mouth daily.   Cholecalciferol 125 MCG (5000 UT) TABS Take 5,000 Units by mouth daily.   Continuous Blood Gluc Sensor (FREESTYLE LIBRE 14 DAY SENSOR) MISC Change sensor every 14 days   cyclobenzaprine (FLEXERIL) 5 MG tablet Take 5 mg by mouth 3 (three) times daily as needed.   dexlansoprazole (DEXILANT) 60 MG capsule Take 1 capsule (60 mg total) by mouth daily.   Docusate Calcium (STOOL SOFTENER PO) Take 2 tablets by mouth daily.   DULoxetine (CYMBALTA) 60 MG capsule Take 60 mg by mouth daily.   glucose blood (ACCU-CHEK GUIDE) test strip Use as instructed   hydrochlorothiazide (MICROZIDE) 12.5 MG capsule Take 12.5 mg by mouth daily.   HYDROcodone-acetaminophen (NORCO/VICODIN) 5-325 MG tablet Take 2 tablets by mouth 2 (  two) times daily.   insulin glargine, 1 Unit Dial, (TOUJEO SOLOSTAR) 300 UNIT/ML Solostar Pen Inject 60 Units into the skin at bedtime.   insulin lispro (HUMALOG KWIKPEN) 100 UNIT/ML KwikPen Inject 20-26 Units into the skin 3 (three) times daily.   Insulin Pen Needle (B-D ULTRAFINE III SHORT PEN) 31G X 8 MM MISC 1 each by Does not apply route as directed. Use to inject insulin 4 times daily   linaclotide (LINZESS) 145 MCG CAPS capsule Take 1 capsule (145 mcg total) by mouth daily before breakfast.   linaclotide (LINZESS) 145 MCG CAPS capsule Take 1 capsule (145 mcg total) by mouth daily before breakfast. Samples of this drug were  given to the patient, quantity 8, Lot Number [id number], [id number] 01/2023   losartan (COZAAR) 100 MG tablet Take 100 mg by mouth daily.   mupirocin ointment (BACTROBAN) 2 % Apply topically 2 (two) times daily.   polyethylene glycol powder (GLYCOLAX/MIRALAX) 17 GM/SCOOP powder Take 17 g by mouth daily.   pregabalin (LYRICA) 100 MG capsule Take 100 mg by mouth 2 (two) times daily.   RESTASIS MULTIDOSE 0.05 % ophthalmic emulsion SMARTSIG:In Eye(s)   sucralfate (CARAFATE) 1 g tablet Take 1 tablet before meal at bedtime dissolve tablet in 10 ml warm water to make into slurry   tirzepatide (MOUNJARO) 5 MG/0.5ML Pen Inject 5 mg into the skin once a week.   TRESIBA FLEXTOUCH 100 UNIT/ML FlexTouch Pen ADMINISTER 70 UNITS UNDER THE SKIN AT BEDTIME   Facility-Administered Encounter Medications as of 08/15/2022  Medication   0.9 %  sodium chloride infusion     REVIEW OF SYSTEMS  : All other systems reviewed and negative except where noted in the History of Present Illness.   PHYSICAL EXAM: BP 138/83 (BP Location: Left Arm, Patient Position: Sitting, Cuff Size: Large)   Pulse 99   Ht 4\' 9"  (1.448 m)   Wt 250 lb (113.4 kg)   LMP 04/07/2016   SpO2 99%   BMI 54.10 kg/m  General: Well developed female in no acute distress Head: Normocephalic and atraumatic Eyes:  Sclerae anicteric, conjunctiva pink. Ears: Normal auditory acuity Lungs: Clear throughout to auscultation; no W/R/R. Heart: Regular rate and rhythm; no M/R/G. Abdomen: Soft, non-distended.  BS present.  Upper abdominal TTP. Musculoskeletal: Symmetrical with no gross deformities  Skin: No lesions on visible extremities Extremities: No edema  Neurological: Alert oriented x 4, grossly non-focal. Psychological:  Alert and cooperative. Normal mood and affect  ASSESSMENT AND PLAN: *GERD, daily nausea and vomiting particularly in the mornings, upper abdominal pain: HIDA scan normal/negative.  She never got the Dexilant, but discontinued the  pantoprazole.  Is only doing Carafate, but says that that is helping.  Will discontinue the Carafate and have her start the Dexilant 60 mg daily.  Hemoglobin A1c of 11.2 so very likely could have gastroparesis.  Will check a gastric emptying scan.  Discussed eating small frequent meals and not eating late at night.  May need Reglan. *Constipation:  Improved with Linzess 145 mcg daily.  Will continue that.  CC:  Sherrilee Gilles, DO

## 2022-08-15 NOTE — Progress Notes (Signed)
Assessment and plan noted ?

## 2022-08-15 NOTE — Patient Instructions (Signed)
_______________________________________________________  If your blood pressure at your visit was 140/90 or greater, please contact your primary care physician to follow up on this.  _______________________________________________________  If you are age 55 or older, your body mass index should be between 23-30. Your Body mass index is 54.1 kg/m. If this is out of the aforementioned range listed, please consider follow up with your Primary Care Provider.  If you are age 50 or younger, your body mass index should be between 19-25. Your Body mass index is 54.1 kg/m. If this is out of the aformentioned range listed, please consider follow up with your Primary Care Provider.   ________________________________________________________  The [place] [facility] providers would like to encourage you to use [facility] to communicate with providers for non-urgent requests or questions.  Due to long hold times on the telephone, sending your provider a message by [facility] may be a faster and more efficient way to get a response.  Please allow 48 business hours for a response.  Please remember that this is for non-urgent requests.  _______________________________________________________  Nicole [name] have been scheduled for a gastric emptying scan at [facility] on 09/02/2022 at 7:30am. Please arrive at least 30 minutes prior to your appointment for registration. Please make certain not to have anything to eat or drink after midnight the night before your test. Hold all stomach medications (ex: Zofran, phenergan, Reglan) 24 hours prior to your test. If you need to reschedule your appointment, please contact radiology scheduling at [phone]. _____________________________________________________________________ A gastric-emptying study measures how long it takes for food to move through your stomach. There are several ways to measure stomach emptying. In the most common test, you eat food that contains a small amount of  radioactive material. A scanner that detects the movement of the radioactive material is placed over your abdomen to monitor the rate at which food leaves your stomach. This test normally takes about 4 hours to complete. _____________________________________________________________________

## 2022-08-22 DIAGNOSIS — G4733 Obstructive sleep apnea (adult) (pediatric): Secondary | ICD-10-CM | POA: Diagnosis not present

## 2022-09-02 ENCOUNTER — Ambulatory Visit (HOSPITAL_COMMUNITY)
Admission: RE | Admit: 2022-09-02 | Discharge: 2022-09-02 | Disposition: A | Discharge status: Home / Self Care | Payer: BC Managed Care – PPO | Source: Ambulatory Visit | Attending: Gastroenterology | Admitting: Gastroenterology

## 2022-09-02 DIAGNOSIS — R112 Nausea with vomiting, unspecified: Secondary | ICD-10-CM | POA: Insufficient documentation

## 2022-09-02 DIAGNOSIS — K219 Gastro-esophageal reflux disease without esophagitis: Secondary | ICD-10-CM | POA: Insufficient documentation

## 2022-09-02 DIAGNOSIS — K5900 Constipation, unspecified: Secondary | ICD-10-CM | POA: Insufficient documentation

## 2022-09-02 MED ORDER — TECHNETIUM TC 99M SULFUR COLLOID
2.0000 | Freq: Once | INTRAVENOUS | Status: AC | PRN
  Administered 2022-09-02: 2 via ORAL

## 2022-09-03 ENCOUNTER — Other Ambulatory Visit: Payer: Self-pay | Admitting: Nurse Practitioner

## 2022-09-03 ENCOUNTER — Ambulatory Visit: Payer: BC Managed Care – PPO | Admitting: Nurse Practitioner

## 2022-09-03 ENCOUNTER — Other Ambulatory Visit: Payer: Self-pay

## 2022-09-03 DIAGNOSIS — E1165 Type 2 diabetes mellitus with hyperglycemia: Secondary | ICD-10-CM

## 2022-09-03 DIAGNOSIS — I10 Essential (primary) hypertension: Secondary | ICD-10-CM

## 2022-09-03 DIAGNOSIS — E782 Mixed hyperlipidemia: Secondary | ICD-10-CM

## 2022-09-03 MED ORDER — METOCLOPRAMIDE HCL 10 MG PO TABS: 10.0000 mg | ORAL_TABLET | Freq: Every evening | ORAL | 6 refills | Status: DC

## 2022-09-04 DIAGNOSIS — H02889 Meibomian gland dysfunction of unspecified eye, unspecified eyelid: Secondary | ICD-10-CM | POA: Diagnosis not present

## 2022-09-12 DIAGNOSIS — G4733 Obstructive sleep apnea (adult) (pediatric): Secondary | ICD-10-CM | POA: Diagnosis not present

## 2022-09-16 ENCOUNTER — Other Ambulatory Visit: Payer: Self-pay | Admitting: *Deleted

## 2022-09-16 DIAGNOSIS — Z794 Long term (current) use of insulin: Secondary | ICD-10-CM

## 2022-09-16 MED ORDER — INSULIN LISPRO (1 UNIT DIAL) 100 UNIT/ML (KWIKPEN): PEN_INJECTOR | SUBCUTANEOUS | 1 refills | Status: DC

## 2022-09-16 NOTE — Telephone Encounter (Signed)
Patient left a message that the [facility] in [place] had shared with her that they had sent a refill request to [place], then told her that they were not sure if they had or not. Patient is requesting that we sent a prescription to the [facility] in [place]. Her last dose of the Humalog was last Friday. In her last office note  06/02/2022 it was documented that she was to inject 22 -28 units three times daily. She had appointment on 09/03/2022 and this one was canceled.  A 1 month supply  with 1 refills has been sent to her pharmacy with a note stating that the patient will need to make appointment prior to further refills.  Patient was called and made aware.

## 2022-09-23 DIAGNOSIS — G4733 Obstructive sleep apnea (adult) (pediatric): Secondary | ICD-10-CM | POA: Diagnosis not present

## 2022-10-09 DIAGNOSIS — E119 Type 2 diabetes mellitus without complications: Secondary | ICD-10-CM | POA: Diagnosis not present

## 2022-10-11 DIAGNOSIS — G4733 Obstructive sleep apnea (adult) (pediatric): Secondary | ICD-10-CM | POA: Diagnosis not present

## 2022-10-22 ENCOUNTER — Ambulatory Visit: Payer: BC Managed Care – PPO | Admitting: Internal Medicine

## 2022-10-23 DIAGNOSIS — G4733 Obstructive sleep apnea (adult) (pediatric): Secondary | ICD-10-CM | POA: Diagnosis not present

## 2022-11-07 DIAGNOSIS — M546 Pain in thoracic spine: Secondary | ICD-10-CM | POA: Diagnosis not present

## 2022-11-07 DIAGNOSIS — M62830 Muscle spasm of back: Secondary | ICD-10-CM | POA: Diagnosis not present

## 2022-11-07 DIAGNOSIS — M9903 Segmental and somatic dysfunction of lumbar region: Secondary | ICD-10-CM | POA: Diagnosis not present

## 2022-11-07 DIAGNOSIS — M9902 Segmental and somatic dysfunction of thoracic region: Secondary | ICD-10-CM | POA: Diagnosis not present

## 2022-11-11 DIAGNOSIS — G4733 Obstructive sleep apnea (adult) (pediatric): Secondary | ICD-10-CM | POA: Diagnosis not present

## 2022-11-12 DIAGNOSIS — M9902 Segmental and somatic dysfunction of thoracic region: Secondary | ICD-10-CM | POA: Diagnosis not present

## 2022-11-12 DIAGNOSIS — M62830 Muscle spasm of back: Secondary | ICD-10-CM | POA: Diagnosis not present

## 2022-11-12 DIAGNOSIS — M9903 Segmental and somatic dysfunction of lumbar region: Secondary | ICD-10-CM | POA: Diagnosis not present

## 2022-11-12 DIAGNOSIS — M546 Pain in thoracic spine: Secondary | ICD-10-CM | POA: Diagnosis not present

## 2022-11-21 DIAGNOSIS — M546 Pain in thoracic spine: Secondary | ICD-10-CM | POA: Diagnosis not present

## 2022-11-21 DIAGNOSIS — M9902 Segmental and somatic dysfunction of thoracic region: Secondary | ICD-10-CM | POA: Diagnosis not present

## 2022-11-21 DIAGNOSIS — M9903 Segmental and somatic dysfunction of lumbar region: Secondary | ICD-10-CM | POA: Diagnosis not present

## 2022-11-21 DIAGNOSIS — M62830 Muscle spasm of back: Secondary | ICD-10-CM | POA: Diagnosis not present

## 2022-11-27 DIAGNOSIS — M9902 Segmental and somatic dysfunction of thoracic region: Secondary | ICD-10-CM | POA: Diagnosis not present

## 2022-11-27 DIAGNOSIS — M62830 Muscle spasm of back: Secondary | ICD-10-CM | POA: Diagnosis not present

## 2022-11-27 DIAGNOSIS — M546 Pain in thoracic spine: Secondary | ICD-10-CM | POA: Diagnosis not present

## 2022-11-27 DIAGNOSIS — M9903 Segmental and somatic dysfunction of lumbar region: Secondary | ICD-10-CM | POA: Diagnosis not present

## 2022-12-04 DIAGNOSIS — M9902 Segmental and somatic dysfunction of thoracic region: Secondary | ICD-10-CM | POA: Diagnosis not present

## 2022-12-04 DIAGNOSIS — M9903 Segmental and somatic dysfunction of lumbar region: Secondary | ICD-10-CM | POA: Diagnosis not present

## 2022-12-04 DIAGNOSIS — M546 Pain in thoracic spine: Secondary | ICD-10-CM | POA: Diagnosis not present

## 2022-12-04 DIAGNOSIS — M62830 Muscle spasm of back: Secondary | ICD-10-CM | POA: Diagnosis not present

## 2022-12-11 DIAGNOSIS — G4733 Obstructive sleep apnea (adult) (pediatric): Secondary | ICD-10-CM | POA: Diagnosis not present

## 2022-12-12 DIAGNOSIS — M9902 Segmental and somatic dysfunction of thoracic region: Secondary | ICD-10-CM | POA: Diagnosis not present

## 2022-12-12 DIAGNOSIS — M9903 Segmental and somatic dysfunction of lumbar region: Secondary | ICD-10-CM | POA: Diagnosis not present

## 2022-12-12 DIAGNOSIS — M62830 Muscle spasm of back: Secondary | ICD-10-CM | POA: Diagnosis not present

## 2022-12-12 DIAGNOSIS — M546 Pain in thoracic spine: Secondary | ICD-10-CM | POA: Diagnosis not present

## 2022-12-19 DIAGNOSIS — M9903 Segmental and somatic dysfunction of lumbar region: Secondary | ICD-10-CM | POA: Diagnosis not present

## 2022-12-19 DIAGNOSIS — M62830 Muscle spasm of back: Secondary | ICD-10-CM | POA: Diagnosis not present

## 2022-12-19 DIAGNOSIS — M9902 Segmental and somatic dysfunction of thoracic region: Secondary | ICD-10-CM | POA: Diagnosis not present

## 2022-12-19 DIAGNOSIS — M546 Pain in thoracic spine: Secondary | ICD-10-CM | POA: Diagnosis not present

## 2022-12-25 DIAGNOSIS — G4733 Obstructive sleep apnea (adult) (pediatric): Secondary | ICD-10-CM | POA: Diagnosis not present

## 2022-12-25 DIAGNOSIS — G479 Sleep disorder, unspecified: Secondary | ICD-10-CM | POA: Diagnosis not present

## 2023-01-02 ENCOUNTER — Other Ambulatory Visit: Payer: Self-pay | Admitting: Nurse Practitioner

## 2023-01-02 DIAGNOSIS — E1165 Type 2 diabetes mellitus with hyperglycemia: Secondary | ICD-10-CM

## 2023-01-09 DIAGNOSIS — M9903 Segmental and somatic dysfunction of lumbar region: Secondary | ICD-10-CM | POA: Diagnosis not present

## 2023-01-09 DIAGNOSIS — M546 Pain in thoracic spine: Secondary | ICD-10-CM | POA: Diagnosis not present

## 2023-01-09 DIAGNOSIS — M9902 Segmental and somatic dysfunction of thoracic region: Secondary | ICD-10-CM | POA: Diagnosis not present

## 2023-01-09 DIAGNOSIS — M62830 Muscle spasm of back: Secondary | ICD-10-CM | POA: Diagnosis not present

## 2023-01-13 ENCOUNTER — Ambulatory Visit: Payer: BC Managed Care – PPO | Admitting: Internal Medicine

## 2023-01-13 ENCOUNTER — Encounter: Payer: Self-pay | Admitting: Internal Medicine

## 2023-01-13 VITALS — BP 126/70 | HR 64 | Ht <= 58 in | Wt 255.0 lb

## 2023-01-13 DIAGNOSIS — R112 Nausea with vomiting, unspecified: Secondary | ICD-10-CM | POA: Diagnosis not present

## 2023-01-13 DIAGNOSIS — K5900 Constipation, unspecified: Secondary | ICD-10-CM | POA: Diagnosis not present

## 2023-01-13 DIAGNOSIS — K219 Gastro-esophageal reflux disease without esophagitis: Secondary | ICD-10-CM

## 2023-01-13 MED ORDER — DEXLANSOPRAZOLE 60 MG PO CPDR: 60.0000 mg | DELAYED_RELEASE_CAPSULE | Freq: Every day | ORAL | 3 refills | Status: DC

## 2023-01-13 MED ORDER — SUCRALFATE 1 G PO TABS: ORAL_TABLET | ORAL | 3 refills | Status: DC

## 2023-01-13 MED ORDER — LINACLOTIDE 145 MCG PO CAPS: 145.0000 ug | ORAL_CAPSULE | Freq: Every day | ORAL | 3 refills | Status: DC

## 2023-01-13 MED ORDER — METOCLOPRAMIDE HCL 10 MG PO TABS: 10.0000 mg | ORAL_TABLET | Freq: Every evening | ORAL | 3 refills | Status: DC

## 2023-01-13 NOTE — Patient Instructions (Signed)
We have sent the following medications to your pharmacy for you to pick up at your convenience:  Linzess, Reglan, Carafate, Dexilant  Please follow up in one year.  _______________________________________________________  If your blood pressure at your visit was 140/90 or greater, please contact your primary care physician to follow up on this.  _______________________________________________________  If you are age 55 or older, your body mass index should be between 23-30. Your Body mass index is 55.18 kg/m. If this is out of the aforementioned range listed, please consider follow up with your Primary Care Provider.  If you are age 97 or younger, your body mass index should be between 19-25. Your Body mass index is 55.18 kg/m. If this is out of the aformentioned range listed, please consider follow up with your Primary Care Provider.   ________________________________________________________  The [place] [facility] providers would like to encourage you to use [facility] to communicate with providers for non-urgent requests or questions.  Due to long hold times on the telephone, sending your provider a message by [facility] may be a faster and more efficient way to get a response.  Please allow 48 business hours for a response.  Please remember that this is for non-urgent requests.  _______________________________________________________

## 2023-01-13 NOTE — Progress Notes (Signed)
HISTORY OF PRESENT ILLNESS:  Nicole [name] is a 57 y.o. female with multiple significant problems including morbid obesity with a BMI 55, poorly controlled diabetes mellitus, hypertension, sleep apnea, fibromyalgia, and GERD.  She presents today for routine follow-up after being seen earlier this year by the GI physician assistant regarding constipation, left upper quadrant pain, and nausea with vomiting.  She has been on GLP-1 agents.  At the time of her last visit she was prescribed Linzess, Dexilant, Carafate, and Reglan.  Patient states that she is doing much better.  No reflux symptoms on Dexilant.  Constipation has improved on Linzess.  Problem left upper quadrant discomfort while bending over has also improved.  She attributes this to better bowel movements.  She does take Carafate as needed for dyspeptic symptoms and Reglan once at night.  She still having difficulties with her weight and control of her diabetes mellitus.  She has had extensive workup including gastric emptying scan, HIDA scan, CT scan, and upper endoscopy.  All have been unremarkable.  She underwent colonoscopy elsewhere in 2020.  This was normal.  REVIEW OF SYSTEMS:  All non-GI ROS negative unless otherwise stated in the HPI except for sinus allergy trouble, anxiety, arthritis, back pain, fatigue, muscle cramps, ankle swelling  Past Medical History:  Diagnosis Date   Depression    Diabetes mellitus    Fibromyalgia    GERD (gastroesophageal reflux disease)    Hiatal hernia    HTN (hypertension)    Obesity    RLS (restless legs syndrome)    Sleep apnea    Vitamin D deficiency    Vitamin D deficiency     Past Surgical History:  Procedure Laterality Date   CATARACT EXTRACTION     CESAREAN SECTION     TUBAL LIGATION      Social History Nicole [name]  reports that she has never smoked. She has never used smokeless tobacco. She reports that she does not drink alcohol and does not use drugs.  family  history includes Arthritis in her mother; Asthma in an other family member; Cancer in an other family member; Diabetes in an other family member; Heart disease in an other family member.  Allergies  Allergen Reactions   Metformin        PHYSICAL EXAMINATION: Vital signs: BP 126/70   Pulse 64   Ht 4\' 9"  (1.448 m)   Wt 255 lb (115.7 kg)   LMP 04/07/2016   BMI 55.18 kg/m   Constitutional: Pleasant, morbidly obese, no acute distress Psychiatric: alert and oriented x3, cooperative Eyes: extraocular movements intact, anicteric, conjunctiva pink Mouth: oral pharynx moist, no lesions Neck: Thick but supple no lymphadenopathy Cardiovascular: heart regular rate and rhythm, no murmur Lungs: clear to auscultation bilaterally Abdomen: soft,, nontender, nondistended, no obvious ascites, no peritoneal signs, normal bowel sounds, no organomegaly Rectal: Omitted Extremities: no clubbing or cyanosis.  Trace lower extremity edema bilaterally Skin: no lesions on visible extremities Neuro: No focal deficits.  Cranial nerves intact  ASSESSMENT:  1.  GERD.  Symptoms have responded to Dexilant 2.  Previous problems with nausea and vomiting.  Improved.  May have been GLP-1 related.  Negative extensive workup 3.  Constipation.  Has responded to Linzess 4.  Normal colonoscopy 2020 5.  Multiple medical problems including poorly controlled diabetes and morbid obesity  PLAN:  1.  Reflux precautions with attention to weight loss 2.  Continue Dexilant.  Prescription refilled.  Medication risk reviewed. 3.  Continue Linzess.  Prescription refilled.  Medication risks reviewed. 4.  Refill Carafate and Reglan per her request.  Medication risks reviewed 5.  Routine colonoscopy around 2030 6.  Routine office follow-up 1 year 7.  Return to the care of your PCP Total time of 30 minutes spent during see the patient, obtaining interval history, performing medically appropriate physical exam, counseling and  educating patient regarding the above listed issues, ordering multiple medications, defining follow-up interval, and documenting clinical information in the health record

## 2023-02-02 DIAGNOSIS — G4733 Obstructive sleep apnea (adult) (pediatric): Secondary | ICD-10-CM | POA: Diagnosis not present

## 2023-02-02 IMAGING — CT CT ABD-PELV W/ CM
2 of 5 series · 16 of 46 positions shown, 18 images · IV contrast (OMNIPAQUE 300)
Comparison: None Available.

CLINICAL DATA: Worsening abdominal pain and cramping for 2 years.
Nausea and vomiting. Bloating. Constipation.

EXAM:
CT ABDOMEN AND PELVIS WITH CONTRAST
TECHNIQUE: Multidetector CT imaging of the abdomen and pelvis was performed
using the standard protocol following bolus administration of
intravenous contrast.

[Series 2: abd/pel w · axial · 0.75mm/px · z∈[-496,-61]mm · 13 of 97 slices shown, 15 images]
[im 5/97  soft-tissue]
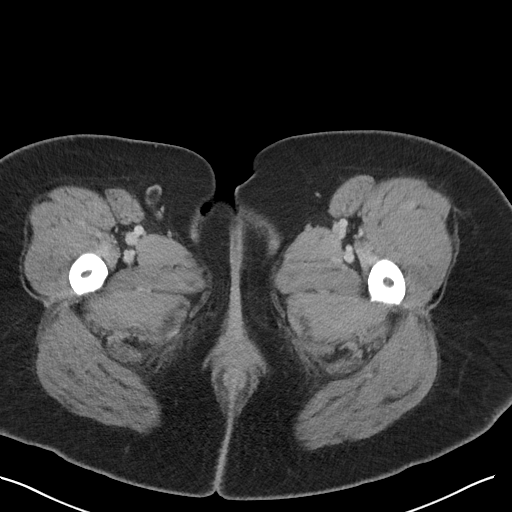
[im 5/97  bone]
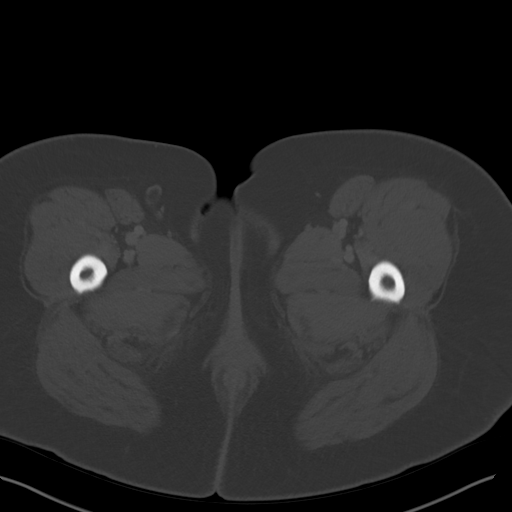
[im 15/97  soft-tissue]
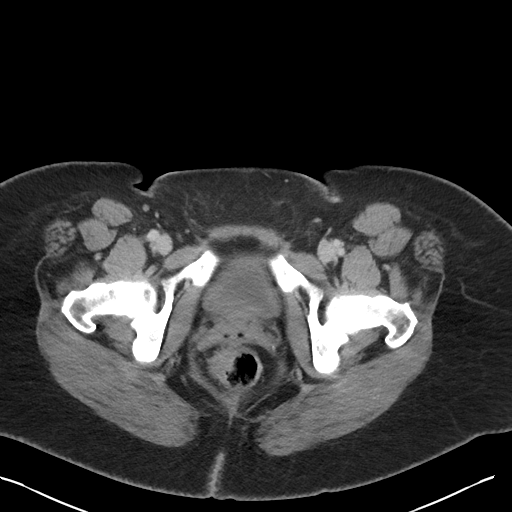
[im 20/97  soft-tissue]
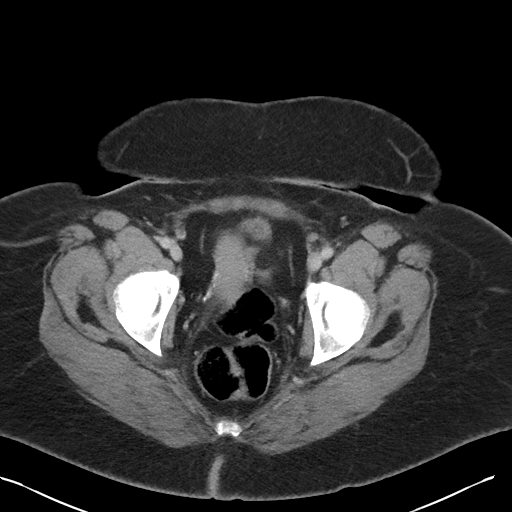
[im 29/97  soft-tissue]
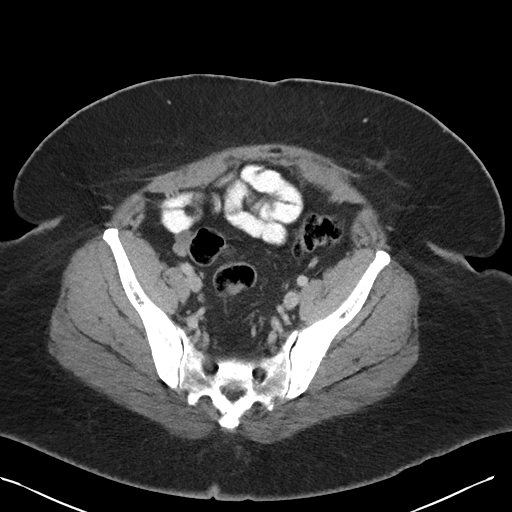
[im 34/97  soft-tissue]
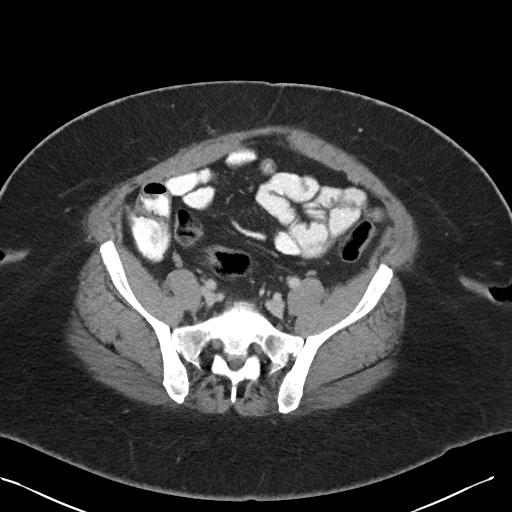
[im 44/97  soft-tissue]
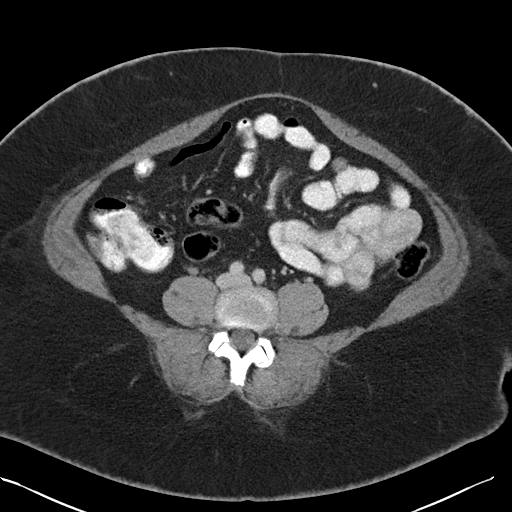
[im 49/97  soft-tissue]
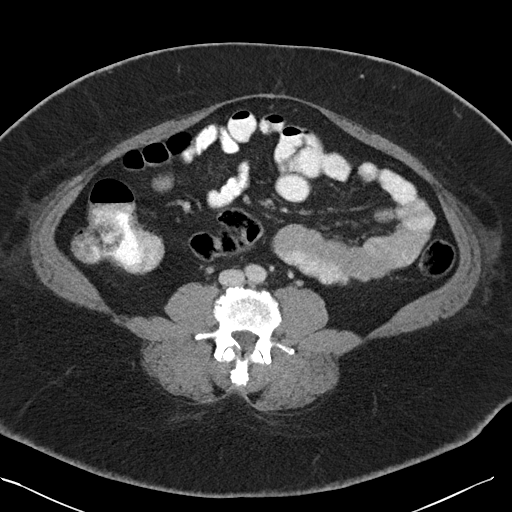
[im 53/97  soft-tissue]
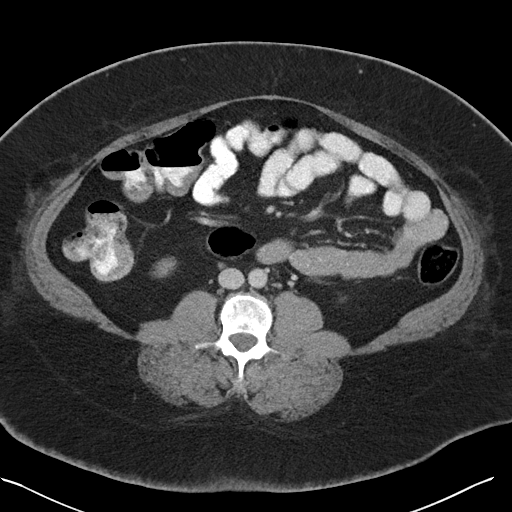
[im 63/97  soft-tissue]
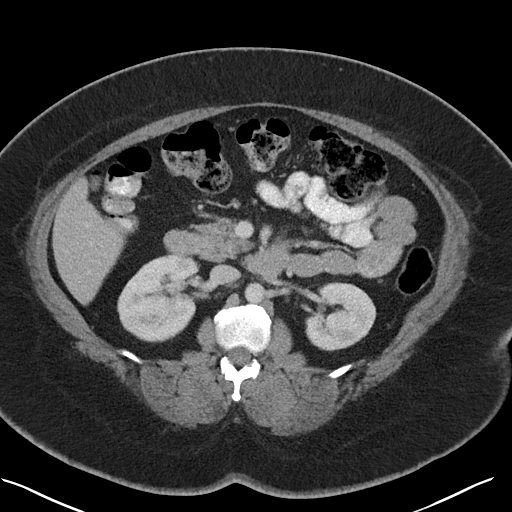
[im 63/97  bone]
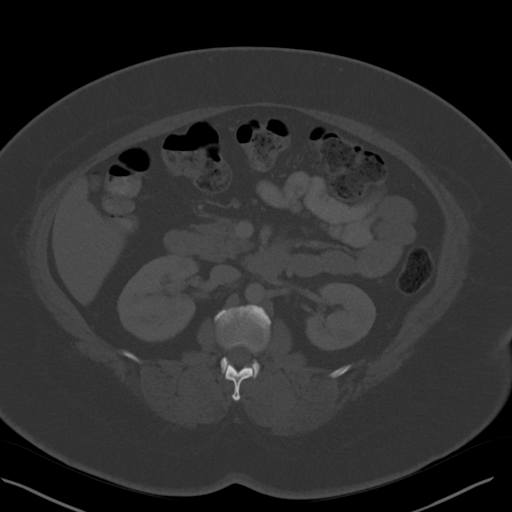
[im 68/97  soft-tissue]
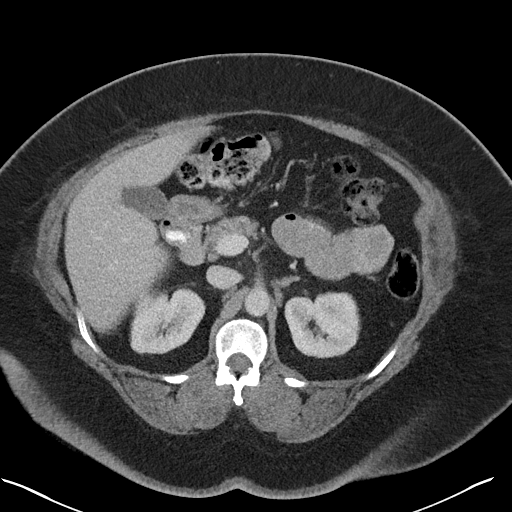
[im 77/97  soft-tissue]
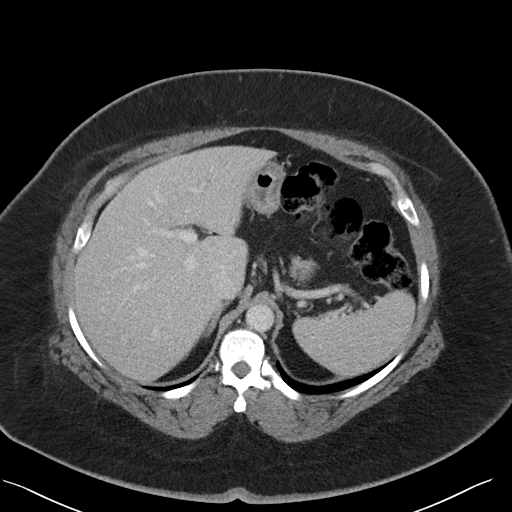
[im 82/97  soft-tissue]
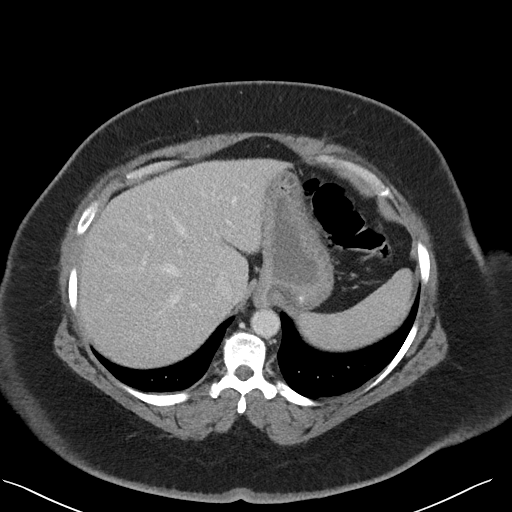
[im 92/97  soft-tissue]
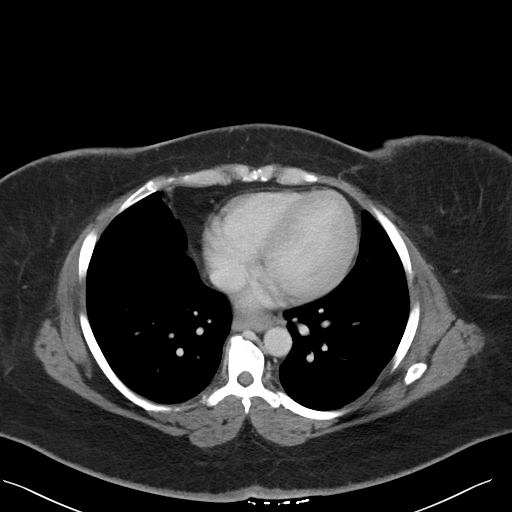

[Series 5: coronal st · coronal · 0.75mm/px · 3 of 102 slices shown]
[im 34/102  soft-tissue]
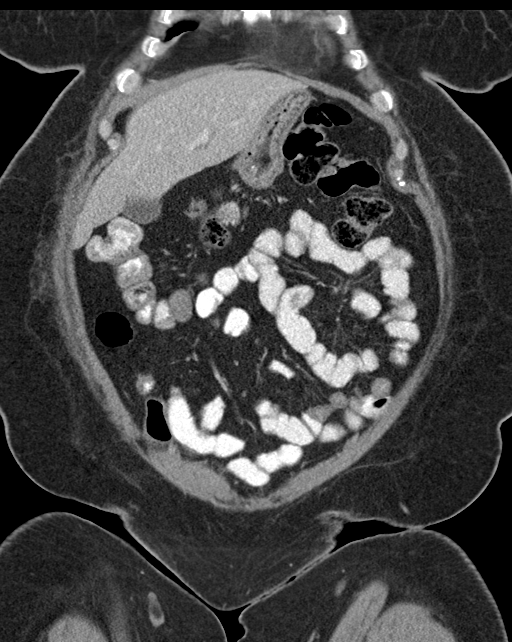
[im 45/102  soft-tissue]
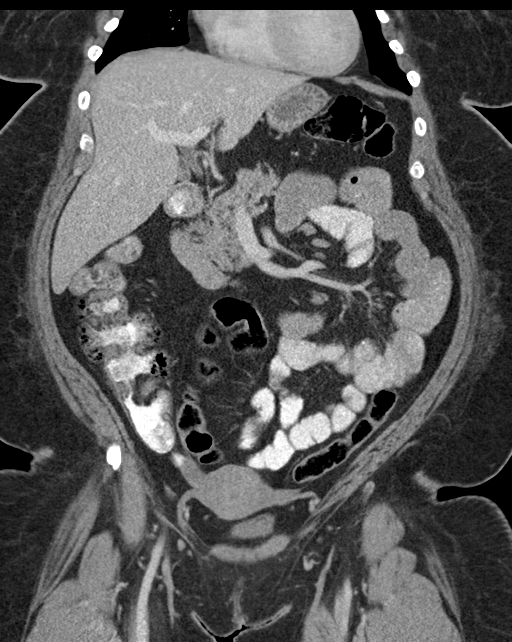
[im 57/102  soft-tissue]
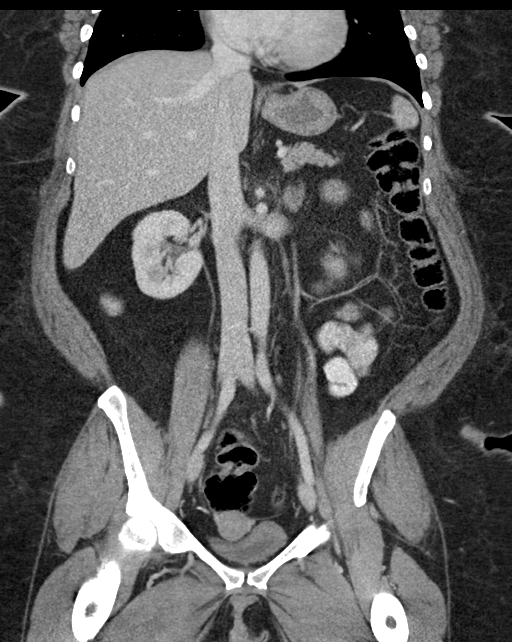

[16 of 46 positions shown; findings below may reference images not displayed]

RADIATION DOSE REDUCTION: This exam was performed according to the
departmental dose-optimization program which includes automated
exposure control, adjustment of the mA and/or kV according to
patient size and/or use of iterative reconstruction technique.

CONTRAST:  100mL OMNIPAQUE IOHEXOL 300 MG/ML  SOLN
FINDINGS: Lower Chest: No acute findings.

Hepatobiliary: No hepatic masses identified. Gallbladder is
unremarkable. No evidence of biliary ductal dilatation.

Pancreas:  No mass or inflammatory changes.

Spleen: Within normal limits in size and appearance.

Adrenals/Urinary Tract: No masses identified. No evidence of
ureteral calculi or hydronephrosis.

Stomach/Bowel: Small hiatal hernia noted. No evidence of
obstruction, inflammatory process or abnormal fluid collections.
Normal appendix visualized.

Vascular/Lymphatic: No pathologically enlarged lymph nodes. No acute
vascular findings.

Reproductive:  No mass or other significant abnormality.

Other:  None.

Musculoskeletal:  No suspicious bone lesions identified.
IMPRESSION: No acute findings within the abdomen or pelvis.

Small hiatal hernia.

## 2023-03-13 ENCOUNTER — Other Ambulatory Visit: Payer: Self-pay | Admitting: Nurse Practitioner

## 2023-04-07 ENCOUNTER — Telehealth: Payer: Self-pay | Admitting: Nurse Practitioner

## 2023-04-07 DIAGNOSIS — E1165 Type 2 diabetes mellitus with hyperglycemia: Secondary | ICD-10-CM

## 2023-04-07 MED ORDER — FREESTYLE LIBRE 14 DAY SENSOR MISC: 1 refills | Status: DC

## 2023-04-07 MED ORDER — INSULIN LISPRO (1 UNIT DIAL) 100 UNIT/ML (KWIKPEN): PEN_INJECTOR | SUBCUTANEOUS | 1 refills | Status: DC

## 2023-04-07 NOTE — Telephone Encounter (Signed)
Pt has appt for 06/02/23. She was notified she has to keep appt.

## 2023-04-07 NOTE — Telephone Encounter (Signed)
Pt will need libre freestyle 14 day sensors and Humalog called into [facility] [address] [place], [place].

## 2023-06-02 ENCOUNTER — Ambulatory Visit: Payer: BC Managed Care – PPO | Admitting: Nurse Practitioner

## 2023-06-02 DIAGNOSIS — E1165 Type 2 diabetes mellitus with hyperglycemia: Secondary | ICD-10-CM

## 2023-06-02 DIAGNOSIS — I10 Essential (primary) hypertension: Secondary | ICD-10-CM

## 2023-06-02 DIAGNOSIS — E782 Mixed hyperlipidemia: Secondary | ICD-10-CM

## 2023-06-02 DIAGNOSIS — Z794 Long term (current) use of insulin: Secondary | ICD-10-CM

## 2023-06-02 DIAGNOSIS — Z7985 Long-term (current) use of injectable non-insulin antidiabetic drugs: Secondary | ICD-10-CM

## 2023-06-23 ENCOUNTER — Other Ambulatory Visit: Payer: Self-pay | Admitting: Nurse Practitioner

## 2023-06-23 DIAGNOSIS — E1165 Type 2 diabetes mellitus with hyperglycemia: Secondary | ICD-10-CM

## 2023-07-26 ENCOUNTER — Other Ambulatory Visit: Payer: Self-pay | Admitting: Nurse Practitioner

## 2023-07-26 DIAGNOSIS — E1165 Type 2 diabetes mellitus with hyperglycemia: Secondary | ICD-10-CM

## 2023-07-28 DIAGNOSIS — I10 Essential (primary) hypertension: Secondary | ICD-10-CM | POA: Diagnosis not present

## 2023-07-28 DIAGNOSIS — M797 Fibromyalgia: Secondary | ICD-10-CM | POA: Diagnosis not present

## 2023-07-28 DIAGNOSIS — E119 Type 2 diabetes mellitus without complications: Secondary | ICD-10-CM | POA: Diagnosis not present

## 2023-07-28 DIAGNOSIS — Z888 Allergy status to other drugs, medicaments and biological substances status: Secondary | ICD-10-CM | POA: Diagnosis not present

## 2023-07-28 DIAGNOSIS — K219 Gastro-esophageal reflux disease without esophagitis: Secondary | ICD-10-CM | POA: Diagnosis not present

## 2023-07-28 DIAGNOSIS — G4730 Sleep apnea, unspecified: Secondary | ICD-10-CM | POA: Diagnosis not present

## 2023-08-11 DIAGNOSIS — G4733 Obstructive sleep apnea (adult) (pediatric): Secondary | ICD-10-CM | POA: Diagnosis not present

## 2023-08-22 ENCOUNTER — Other Ambulatory Visit: Payer: Self-pay | Admitting: Nurse Practitioner

## 2023-08-22 DIAGNOSIS — E1165 Type 2 diabetes mellitus with hyperglycemia: Secondary | ICD-10-CM

## 2023-08-26 ENCOUNTER — Ambulatory Visit: Payer: BC Managed Care – PPO | Admitting: Nurse Practitioner

## 2023-08-26 ENCOUNTER — Other Ambulatory Visit: Payer: Self-pay

## 2023-08-26 ENCOUNTER — Encounter: Payer: Self-pay | Admitting: Nurse Practitioner

## 2023-08-26 VITALS — BP 137/84 | HR 92 | Ht <= 58 in | Wt 253.0 lb

## 2023-08-26 DIAGNOSIS — E1165 Type 2 diabetes mellitus with hyperglycemia: Secondary | ICD-10-CM | POA: Diagnosis not present

## 2023-08-26 DIAGNOSIS — E782 Mixed hyperlipidemia: Secondary | ICD-10-CM

## 2023-08-26 DIAGNOSIS — Z794 Long term (current) use of insulin: Secondary | ICD-10-CM | POA: Diagnosis not present

## 2023-08-26 DIAGNOSIS — Z7985 Long-term (current) use of injectable non-insulin antidiabetic drugs: Secondary | ICD-10-CM

## 2023-08-26 DIAGNOSIS — I10 Essential (primary) hypertension: Secondary | ICD-10-CM

## 2023-08-26 LAB — POCT GLYCOSYLATED HEMOGLOBIN (HGB A1C): Hemoglobin A1C: 9.5 % — AB (ref 4.0–5.6)

## 2023-08-26 MED ORDER — FREESTYLE LIBRE 3 PLUS SENSOR MISC: 3 refills | Status: DC

## 2023-08-26 MED ORDER — TIRZEPATIDE 7.5 MG/0.5ML ~~LOC~~ SOAJ: 7.5000 mg | SUBCUTANEOUS | 1 refills | Status: DC

## 2023-08-26 MED ORDER — INSULIN LISPRO (1 UNIT DIAL) 100 UNIT/ML (KWIKPEN): 16.0000 [IU] | PEN_INJECTOR | Freq: Three times a day (TID) | SUBCUTANEOUS | 3 refills | Status: DC

## 2023-08-26 MED ORDER — TRESIBA FLEXTOUCH 100 UNIT/ML ~~LOC~~ SOPN: 50.0000 [IU] | PEN_INJECTOR | Freq: Every day | SUBCUTANEOUS | 3 refills | Status: DC

## 2023-08-26 MED ORDER — BD PEN NEEDLE SHORT U/F 31G X 8 MM MISC: 1.0000 | 3 refills | Status: DC

## 2023-08-26 NOTE — Progress Notes (Signed)
Endocrinology Follow Up Note       08/26/2023, 8:50 AM   Subjective:    Patient ID: Nicole [name], female    DOB: 05/29/1966.  Nicole [name] is being seen in follow up after being seen in consultation for management of currently uncontrolled symptomatic diabetes requested by  Jonathon Bellows, DO.   Past Medical History:  Diagnosis Date   Depression    Diabetes mellitus    Fibromyalgia    GERD (gastroesophageal reflux disease)    Hiatal hernia    HTN (hypertension)    Obesity    RLS (restless legs syndrome)    Sleep apnea    Vitamin D deficiency    Vitamin D deficiency     Past Surgical History:  Procedure Laterality Date   CATARACT EXTRACTION     CESAREAN SECTION     TUBAL LIGATION      Social History   Socioeconomic History   Marital status: Married    Spouse name: Not on file   Number of children: Not on file   Years of education: MBA   Highest education level: Not on file  Occupational History   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  Tobacco Use   Smoking status: Never   Smokeless tobacco: Never  Vaping Use   Vaping status: Never Used  Substance and Sexual Activity   Alcohol use: No   Drug use: No   Sexual activity: Not on file  Other Topics Concern   Not on file  Social History Narrative   Not on file   Social Drivers of Health   Financial Resource Strain: Not on file  Food Insecurity: Not on file  Transportation Needs: Not on file  Physical Activity: Not on file  Stress: Not on file  Social Connections: Not on file    Family History  Problem Relation Age of Onset   Arthritis Mother    Heart disease Other    Cancer Other    Asthma Other    Diabetes Other    Rectal cancer Neg Hx    Esophageal cancer Neg Hx    Stomach cancer Neg Hx     Outpatient Encounter Medications as of 08/26/2023  Medication Sig   amLODipine (NORVASC) 5 MG tablet Take 5  mg by mouth daily.   aspirin EC 81 MG tablet Take 81 mg by mouth daily.   celecoxib (CELEBREX) 200 MG capsule Take 200 mg by mouth daily.   Cholecalciferol 125 MCG (5000 UT) TABS Take 5,000 Units by mouth daily.   Continuous Glucose Sensor (FREESTYLE LIBRE 3 PLUS SENSOR) MISC Change sensor every 15 days.   cyclobenzaprine (FLEXERIL) 5 MG tablet Take 5 mg by mouth 3 (three) times daily as needed.   dexlansoprazole (DEXILANT) 60 MG capsule Take 1 capsule (60 mg total) by mouth daily.   Docusate Calcium (STOOL SOFTENER PO) Take 2 tablets by mouth daily.   DULoxetine (CYMBALTA) 60 MG capsule Take 60 mg by mouth daily.   hydrochlorothiazide (MICROZIDE) 12.5 MG capsule Take 12.5 mg by mouth daily.   linaclotide (LINZESS) 145 MCG CAPS capsule Take 1 capsule (145 mcg total) by mouth daily before breakfast.  Samples of this drug were given to the patient, quantity 8, Lot Number [id number], [id number] 01/2023   linaclotide (LINZESS) 145 MCG CAPS capsule Take 1 capsule (145 mcg total) by mouth daily before breakfast.   losartan (COZAAR) 100 MG tablet Take 100 mg by mouth daily.   metoCLOPramide (REGLAN) 10 MG tablet Take 1 tablet (10 mg total) by mouth at bedtime.   polyethylene glycol powder (GLYCOLAX/MIRALAX) 17 GM/SCOOP powder Take 17 g by mouth daily.   pregabalin (LYRICA) 100 MG capsule Take 100 mg by mouth 2 (two) times daily.   RESTASIS MULTIDOSE 0.05 % ophthalmic emulsion SMARTSIG:In Eye(s)   sucralfate (CARAFATE) 1 g tablet Take 1 tablet before meal at bedtime dissolve tablet in 10 ml warm water to make into slurry   tirzepatide (MOUNJARO) 7.5 MG/0.5ML Pen Inject 7.5 mg into the skin once a week.   [DISCONTINUED] Continuous Glucose Sensor (FREESTYLE LIBRE 14 DAY SENSOR) MISC APPLY ONE SENSOR TOPICALLY EVERY 14 DAYS   [DISCONTINUED] insulin lispro (HUMALOG) 100 UNIT/ML KwikPen INJECT 22-28 UNITS UNDER THE SKIN THREE TIMES DAILY   [DISCONTINUED] Insulin Pen Needle (B-D ULTRAFINE III SHORT PEN) 31G X 8 MM  MISC 1 each by Does not apply route as directed. Use to inject insulin 4 times daily   [DISCONTINUED] tirzepatide (MOUNJARO) 5 MG/0.5ML Pen Inject 5 mg into the skin once a week.   [DISCONTINUED] TRESIBA FLEXTOUCH 100 UNIT/ML FlexTouch Pen ADMINISTER 70 UNITS UNDER THE SKIN AT BEDTIME   Blood Glucose Monitoring Suppl (ACCU-CHEK GUIDE) w/Device KIT 1 Piece by Does not apply route as directed. (Patient not taking: Reported on 08/26/2023)   glucose blood (ACCU-CHEK GUIDE) test strip Use as instructed (Patient not taking: Reported on 08/26/2023)   HYDROcodone-acetaminophen (NORCO/VICODIN) 5-325 MG tablet Take 2 tablets by mouth 2 (two) times daily. (Patient not taking: Reported on 08/26/2023)   insulin lispro (HUMALOG) 100 UNIT/ML KwikPen Inject 16-22 Units into the skin 3 (three) times daily. INJECT 22-28 UNITS UNDER THE SKIN THREE TIMES DAILY   Insulin Pen Needle (B-D ULTRAFINE III SHORT PEN) 31G X 8 MM MISC 1 each by Does not apply route as directed. Use to inject insulin 4 times daily   mupirocin ointment (BACTROBAN) 2 % Apply topically 2 (two) times daily. (Patient not taking: Reported on 08/26/2023)   TRESIBA FLEXTOUCH 100 UNIT/ML FlexTouch Pen Inject 50 Units into the skin at bedtime.   [DISCONTINUED] insulin glargine, 1 Unit Dial, (TOUJEO SOLOSTAR) 300 UNIT/ML Solostar Pen Inject 60 Units into the skin at bedtime. (Patient not taking: Reported on 08/26/2023)   Facility-Administered Encounter Medications as of 08/26/2023  Medication   0.9 %  sodium chloride infusion    ALLERGIES: Allergies  Allergen Reactions   Metformin     VACCINATION STATUS: Immunization History  Administered Date(s) Administered   Moderna Sars-Covid-2 Vaccination 11/23/2019, 12/21/2019    Diabetes She presents for her follow-up diabetic visit. She has type 2 diabetes mellitus. Onset time: Diagnosed at approx age of 28. Her disease course has been fluctuating. Hypoglycemia symptoms include dizziness,  nervousness/anxiousness and tremors. Associated symptoms include fatigue and foot paresthesias. Pertinent negatives for diabetes include no blurred vision, no polydipsia, no polyphagia, no polyuria and no weight loss. There are no hypoglycemic complications. Symptoms are stable. Diabetic complications include nephropathy. Risk factors for coronary artery disease include diabetes mellitus, dyslipidemia, family history, obesity, hypertension, sedentary lifestyle and stress. Current diabetic treatment includes intensive insulin program (and Mounjaro). She is compliant with treatment some of the time (has had trouble getting  her medications from [facility] consistently). Her weight is increasing steadily. She is following a generally unhealthy diet. When asked about meal planning, she reported none. She has not had a previous visit with a dietitian. She rarely participates in exercise. Her home blood glucose trend is fluctuating dramatically. Her overall blood glucose range is >200 mg/dl. (She presents today after long absence with her CGM showing dramatically fluctuating glycemic profile.  Her POCT A1c today is 9.5%, improving from last visit here of 11.2%.  She notes her PCP has been refilling her medications in the interim while she waited on follow up appt here.  She has had trouble with her back, is getting steroid injections, will be having another in March, trying to avoid surgery.  Analysis of her CGM shows TIR 39%, TAR 54%, TBR 7% with a GMI of 8.1%.) An ACE inhibitor/angiotensin II receptor blocker is being taken. She does not see a podiatrist.Eye exam is current.  Hyperlipidemia This is a chronic problem. The current episode started more than 1 year ago. The problem is controlled. Recent lipid tests were reviewed and are normal. Exacerbating diseases include chronic renal disease, diabetes and obesity. Factors aggravating her hyperlipidemia include thiazides. She is currently on no antihyperlipidemic  treatment. Compliance problems include adherence to diet, adherence to exercise and psychosocial issues.  Risk factors for coronary artery disease include diabetes mellitus, dyslipidemia, hypertension, obesity, a sedentary lifestyle, stress and family history.  Hypertension This is a chronic problem. The current episode started more than 1 year ago. The problem has been gradually improving since onset. The problem is controlled. Pertinent negatives include no blurred vision. There are no associated agents to hypertension. Risk factors for coronary artery disease include diabetes mellitus, dyslipidemia, family history, obesity, sedentary lifestyle and stress. Past treatments include angiotensin blockers and diuretics. The current treatment provides moderate improvement. Compliance problems include diet and exercise.  Hypertensive end-organ damage includes kidney disease. Identifiable causes of hypertension include chronic renal disease.     Review of systems  Constitutional: + stable body weight, current Body mass index is 54.75 kg/m., + fatigue, no subjective hyperthermia Eyes: no blurry vision, no xerophthalmia ENT: no sore throat, no nodules palpated in throat, no dysphagia/odynophagia, no hoarseness Cardiovascular: no chest pain, no shortness of breath, no palpitations, no leg swelling Respiratory: no cough, no shortness of breath Gastrointestinal: no nausea/vomiting/diarrhea Musculoskeletal: + back pain (seeing specialist) Skin: no rashes, no hyperemia Neurological: no tremors, + numbness and tingling to B feet, no dizziness Psychiatric: no depression and anxiety  Objective:     BP 137/84 (BP Location: Right Arm, Patient Position: Sitting, Cuff Size: Large)   Pulse 92   Ht 4\' 9"  (1.448 m)   Wt 253 lb (114.8 kg)   LMP 04/07/2016   BMI 54.75 kg/m   Wt Readings from Last 3 Encounters:  08/26/23 253 lb (114.8 kg)  01/13/23 255 lb (115.7 kg)  08/15/22 250 lb (113.4 kg)     BP  Readings from Last 3 Encounters:  08/26/23 137/84  01/13/23 126/70  08/15/22 138/83       Physical Exam- Limited  Constitutional:  Body mass index is 54.75 kg/m. , not in acute distress, normal state of mind Eyes:  EOMI, no exophthalmos Musculoskeletal: no gross deformities, strength intact in all four extremities, no gross restriction of joint movements Skin:  no rashes, no hyperemia Neurological: no tremor with outstretched hands   Diabetic Foot Exam - Simple   Simple Foot Form Diabetic Foot exam was performed  with the following findings: Yes 08/26/2023  8:40 AM  Visual Inspection No deformities, no ulcerations, no other skin breakdown bilaterally: Yes Sensation Testing Intact to touch and monofilament testing bilaterally: Yes Pulse Check Posterior Tibialis and Dorsalis pulse intact bilaterally: Yes Comments     CMP ( most recent) CMP     Component Value Date/Time   NA 137 12/19/2021 1045   NA 140 05/23/2021 0940   K 3.7 12/19/2021 1045   CL 102 12/19/2021 1045   CO2 28 12/19/2021 1045   GLUCOSE 291 (H) 12/19/2021 1045   BUN 12 12/19/2021 1045   BUN 17 05/23/2021 0940   CREATININE 0.97 12/19/2021 1045   CALCIUM 9.6 12/19/2021 1045   PROT 7.1 05/23/2021 0940   ALBUMIN 4.2 05/23/2021 0940   AST 12 05/23/2021 0940   ALT 13 05/23/2021 0940   ALKPHOS 133 (H) 05/23/2021 0940   BILITOT 0.4 05/23/2021 0940   GFRAA 68 12/25/2020 0000     Diabetic Labs (most recent): Lab Results  Component Value Date   HGBA1C 9.5 (A) 08/26/2023   HGBA1C 11.2 (A) 06/02/2022   HGBA1C 12.0 02/27/2022   MICROALBUR 30 02/27/2022   MICROALBUR 10 02/18/2021     Lipid Panel ( most recent) Lipid Panel     Component Value Date/Time   CHOL 157 05/23/2021 0940   TRIG 109 05/23/2021 0940   HDL 50 05/23/2021 0940   CHOLHDL 3.1 05/23/2021 0940   LDLCALC 87 05/23/2021 0940   LABVLDL 20 05/23/2021 0940      Lab Results  Component Value Date   TSH 3.120 05/23/2021   FREET4 1.12  05/23/2021           Assessment & Plan:   1) Uncontrolled type 2 diabetes mellitus with hyperglycemia (HCC)  She presents today after long absence with her CGM showing dramatically fluctuating glycemic profile.  Her POCT A1c today is 9.5%, improving from last visit here of 11.2%.  She notes her PCP has been refilling her medications in the interim while she waited on follow up appt here.  She has had trouble with her back, is getting steroid injections, will be having another in March, trying to avoid surgery.  Analysis of her CGM shows TIR 39%, TAR 54%, TBR 7% with a GMI of 8.1%.  - Nicole [name] has currently uncontrolled symptomatic type 2 DM since 58 years of age.  -Recent labs reviewed.  - I had a long discussion with her about the progressive nature of diabetes and the pathology behind its complications. -her diabetes is complicated by CKD stage 2, retinopathy, neuropathy and she remains at a high risk for more acute and chronic complications which include CAD, CVA, CKD, retinopathy, and neuropathy. These are all discussed in detail with her.  - Nutritional counseling repeated at each appointment due to patients tendency to fall back in to old habits.  - The patient admits there is a room for improvement in their diet and drink choices. -  Suggestion is made for the patient to avoid simple carbohydrates from their diet including Cakes, Sweet Desserts / Pastries, Ice Cream, Soda (diet and regular), Sweet Tea, Candies, Chips, Cookies, Sweet Pastries, Store Bought Juices, Alcohol in Excess of 1-2 drinks a day, Artificial Sweeteners, Coffee Creamer, and "Sugar-free" Products. This will help patient to have stable blood glucose profile and potentially avoid unintended weight gain.   - I encouraged the patient to switch to unprocessed or minimally processed complex starch and increased protein intake (animal or  plant source), fruits, and vegetables.   - Patient is advised to stick to a  routine mealtimes to eat 3 meals a day and avoid unnecessary snacks (to snack only to correct hypoglycemia).  - I have approached her with the following individualized plan to manage her diabetes and patient agrees:   -She is advised to lower [place] to 50 units SQ nightly and lower her Humalog to 16-22 units TID with meals if glucose is above 90 and she is eating (Specific instructions on how to titrate insulin dosage based on glucose readings given to patient in writing), and increase Mounjaro to 7.5 mg SQ weekly.     -she is encouraged to continue monitoring glucose 4 times daily (using her CGM), before meals and before bed, and to call the clinic if she has readings less than 70 or above 300 for 3 tests in a row.  Will send in for upgrade to [facility] 3 plus sensor for her since older products are being discontinued.  - she is warned not to take insulin without proper monitoring per orders. - Adjustment parameters are given to her for hypo and hyperglycemia in writing.  - Specific targets for  A1c; LDL, HDL, and Triglycerides were discussed with the patient.  2) Blood Pressure /Hypertension:  her blood pressure is controlled to target.   she is advised to continue her current medications as prescribed by PCP.  3) Lipids/Hyperlipidemia:    Review of her recent lipid panel from 05/23/21 showed controlled LDL at 87. She is advised to avoid fried foods and butter.  She notes she had labs in October.  Will try to get results sent over for our records.  4)  Weight/Diet:  her Body mass index is 54.75 kg/m.  -  clearly complicating her diabetes care.   she is a candidate for weight loss. I discussed with her the fact that loss of 5 - 10% of her  current body weight will have the most impact on her diabetes management.  Exercise, and detailed carbohydrates information provided  -  detailed on discharge instructions.  5) Chronic Care/Health Maintenance: -she is on ACEI/ARB and is encouraged to initiate  and continue to follow up with Ophthalmology, Dentist, Podiatrist at least yearly or according to recommendations, and advised to stay away from smoking. I have recommended yearly flu vaccine and pneumonia vaccine at least every 5 years; moderate intensity exercise for up to 150 minutes weekly; and sleep for at least 7 hours a day.  - she is advised to maintain close follow up with Jonathon Bellows, DO for primary care needs, as well as her other providers for optimal and coordinated care.     I spent  55  minutes in the care of the patient today including review of labs from CMP, Lipids, Thyroid Function, Hematology (current and previous including abstractions from other facilities); face-to-face time discussing  her blood glucose readings/logs, discussing hypoglycemia and hyperglycemia episodes and symptoms, medications doses, her options of short and long term treatment based on the latest standards of care / guidelines;  discussion about incorporating lifestyle medicine;  and documenting the encounter. Risk reduction counseling performed per USPSTF guidelines to reduce obesity and cardiovascular risk factors.     Please refer to Patient Instructions for Blood Glucose Monitoring and Insulin/Medications Dosing Guide"  in media tab for additional information. Please  also refer to " Patient Self Inventory" in the Media  tab for reviewed elements of pertinent patient history.  Nicole [name]  participated in the discussions, expressed understanding, and voiced agreement with the above plans.  All questions were answered to her satisfaction. she is encouraged to contact clinic should she have any questions or concerns prior to her return visit.     Follow up plan: - Return in about 3 months (around 11/24/2023) for Diabetes F/U with A1c in office, No previsit labs, Bring meter and logs.   Ronny Bacon, [facility] [address] [place], [place]  [address] Phone: [phone] Fax: [phone]  08/26/2023, 8:50 AM

## 2023-09-06 DIAGNOSIS — M797 Fibromyalgia: Secondary | ICD-10-CM | POA: Diagnosis not present

## 2023-09-06 DIAGNOSIS — Z6841 Body Mass Index (BMI) 40.0 and over, adult: Secondary | ICD-10-CM | POA: Diagnosis not present

## 2023-09-16 ENCOUNTER — Other Ambulatory Visit: Payer: Self-pay | Admitting: Nurse Practitioner

## 2023-09-21 ENCOUNTER — Other Ambulatory Visit (HOSPITAL_COMMUNITY): Payer: Self-pay

## 2023-09-21 ENCOUNTER — Telehealth: Payer: BC Managed Care – PPO | Admitting: Nurse Practitioner

## 2023-09-21 ENCOUNTER — Telehealth: Payer: Self-pay

## 2023-09-21 ENCOUNTER — Other Ambulatory Visit: Payer: Self-pay | Admitting: Nurse Practitioner

## 2023-09-21 MED ORDER — LANTUS SOLOSTAR 100 UNIT/ML ~~LOC~~ SOPN: 50.0000 [IU] | PEN_INJECTOR | Freq: Every day | SUBCUTANEOUS | 3 refills | Status: DC

## 2023-09-21 NOTE — Telephone Encounter (Signed)
 PA team opened new encounter, see message

## 2023-09-21 NOTE — Telephone Encounter (Signed)
 Pharmacy Patient Advocate Encounter   Received notification from Pt Calls Messages that prior authorization for Nicole [name] is required/requested.   Insurance verification completed.   The patient is insured through [facility] .   Per test claim: product/service not covered - Plan/benefit exclusion

## 2023-09-22 NOTE — Telephone Encounter (Signed)
 Patient was called and given this information. She will reach back out to [place] when she finds out something.

## 2023-09-22 NOTE — Telephone Encounter (Signed)
 Well, that's no good.  Can you call her and let her know?  I would recommend she call her insurance to ask which GLP/GIP medications are covered under her benefits and maybe we can switch.

## 2023-09-22 NOTE — Telephone Encounter (Signed)
 Patient left a message on the front desk line to call her back

## 2023-09-23 ENCOUNTER — Telehealth: Payer: Self-pay | Admitting: *Deleted

## 2023-09-23 NOTE — Telephone Encounter (Signed)
 Returned the patient's call, voicemail box is full unable to leave a message.

## 2023-09-23 NOTE — Telephone Encounter (Signed)
 Patient shares with me that per her pharmacy we need to do urgent PA on her behalf for [facility]. Patient was seen in the office on 08/26/2023, at this time the [facility] was increased to the 7.5 mg dose injecting weekly. Patient was advised that we would send this over to the RX PA team to review.

## 2023-09-24 ENCOUNTER — Other Ambulatory Visit (HOSPITAL_COMMUNITY): Payer: Self-pay

## 2023-09-25 NOTE — Telephone Encounter (Signed)
 Patient was called and a message was left. Patient was advised that Greggory Keen is not covered and that she may want to reach out to her [employer].

## 2023-10-06 ENCOUNTER — Other Ambulatory Visit (HOSPITAL_COMMUNITY): Payer: Self-pay

## 2023-10-10 ENCOUNTER — Encounter: Payer: Self-pay | Admitting: Nurse Practitioner

## 2023-10-23 ENCOUNTER — Other Ambulatory Visit (HOSPITAL_COMMUNITY): Payer: Self-pay | Admitting: Family Medicine

## 2023-10-23 DIAGNOSIS — Z1231 Encounter for screening mammogram for malignant neoplasm of breast: Secondary | ICD-10-CM

## 2023-11-09 ENCOUNTER — Ambulatory Visit (HOSPITAL_COMMUNITY)
Admission: RE | Admit: 2023-11-09 | Discharge: 2023-11-09 | Disposition: A | Discharge status: Home / Self Care | Source: Ambulatory Visit | Attending: Family Medicine | Admitting: Family Medicine

## 2023-11-09 ENCOUNTER — Encounter (HOSPITAL_COMMUNITY): Payer: Self-pay

## 2023-11-09 DIAGNOSIS — Z1231 Encounter for screening mammogram for malignant neoplasm of breast: Secondary | ICD-10-CM | POA: Diagnosis not present

## 2023-12-03 ENCOUNTER — Ambulatory Visit: Payer: BC Managed Care – PPO | Admitting: Nurse Practitioner

## 2023-12-31 ENCOUNTER — Encounter: Payer: Self-pay | Admitting: Nurse Practitioner

## 2023-12-31 ENCOUNTER — Ambulatory Visit: Admitting: Nurse Practitioner

## 2023-12-31 VITALS — BP 124/80 | HR 94 | Ht <= 58 in | Wt 253.8 lb

## 2023-12-31 DIAGNOSIS — Z794 Long term (current) use of insulin: Secondary | ICD-10-CM

## 2023-12-31 DIAGNOSIS — E782 Mixed hyperlipidemia: Secondary | ICD-10-CM | POA: Diagnosis not present

## 2023-12-31 DIAGNOSIS — Z7985 Long-term (current) use of injectable non-insulin antidiabetic drugs: Secondary | ICD-10-CM

## 2023-12-31 DIAGNOSIS — I10 Essential (primary) hypertension: Secondary | ICD-10-CM | POA: Diagnosis not present

## 2023-12-31 DIAGNOSIS — E1165 Type 2 diabetes mellitus with hyperglycemia: Secondary | ICD-10-CM | POA: Diagnosis not present

## 2023-12-31 LAB — POCT GLYCOSYLATED HEMOGLOBIN (HGB A1C): Hemoglobin A1C: 12.3 % — AB (ref 4.0–5.6)

## 2023-12-31 MED ORDER — INSULIN LISPRO (1 UNIT DIAL) 100 UNIT/ML (KWIKPEN): 20.0000 [IU] | PEN_INJECTOR | Freq: Three times a day (TID) | SUBCUTANEOUS | 3 refills | Status: DC

## 2023-12-31 MED ORDER — LANTUS SOLOSTAR 100 UNIT/ML ~~LOC~~ SOPN: 70.0000 [IU] | PEN_INJECTOR | Freq: Every day | SUBCUTANEOUS | 3 refills | Status: DC

## 2023-12-31 MED ORDER — TIRZEPATIDE 5 MG/0.5ML ~~LOC~~ SOAJ: 5.0000 mg | SUBCUTANEOUS | 1 refills | Status: DC

## 2023-12-31 NOTE — Progress Notes (Signed)
 Endocrinology Follow Up Note       12/31/2023, 4:01 PM   Subjective:    Patient ID: Nicole [name], female    DOB: 05-18-1966.  Nicole [name] is being seen in follow up after being seen in consultation for management of currently uncontrolled symptomatic diabetes requested by  Ava Lei, DO.   Past Medical History:  Diagnosis Date   Depression    Diabetes mellitus    Fibromyalgia    GERD (gastroesophageal reflux disease)    Hiatal hernia    HTN (hypertension)    Obesity    RLS (restless legs syndrome)    Sleep apnea    Vitamin D  deficiency    Vitamin D  deficiency     Past Surgical History:  Procedure Laterality Date   CATARACT EXTRACTION     CESAREAN SECTION     TUBAL LIGATION      Social History   Socioeconomic History   Marital status: Married    Spouse name: Not on file   Number of children: Not on file   Years of education: MBA   Highest education level: Not on file  Occupational History   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  Tobacco Use   Smoking status: Never   Smokeless tobacco: Never  Vaping Use   Vaping status: Never Used  Substance and Sexual Activity   Alcohol use: No   Drug use: No   Sexual activity: Not on file  Other Topics Concern   Not on file  Social History Narrative   Not on file   Social Drivers of Health   Financial Resource Strain: Not on file  Food Insecurity: Not on file  Transportation Needs: Not on file  Physical Activity: Not on file  Stress: Not on file  Social Connections: Not on file    Family History  Problem Relation Age of Onset   Arthritis Mother    Heart disease Other    Cancer Other    Asthma Other    Diabetes Other    Rectal cancer Neg Hx    Esophageal cancer Neg Hx    Stomach cancer Neg Hx     Outpatient Encounter Medications as of 12/31/2023  Medication Sig   amLODipine (NORVASC) 5 MG tablet Take 5 mg  by mouth daily.   aspirin EC 81 MG tablet Take 81 mg by mouth daily.   Baclofen 5 MG TABS Take 1 tablet by mouth daily.   celecoxib (CELEBREX) 200 MG capsule Take 200 mg by mouth daily.   Cholecalciferol 125 MCG (5000 UT) TABS Take 5,000 Units by mouth daily.   Continuous Glucose Sensor (FREESTYLE LIBRE 3 PLUS SENSOR) MISC Change sensor every 15 days.   cyclobenzaprine (FLEXERIL) 5 MG tablet Take 5 mg by mouth 3 (three) times daily as needed.   dexlansoprazole  (DEXILANT ) 60 MG capsule Take 1 capsule (60 mg total) by mouth daily.   Docusate Calcium (STOOL SOFTENER PO) Take 2 tablets by mouth daily.   DULoxetine (CYMBALTA) 60 MG capsule Take 60 mg by mouth daily.   hydrochlorothiazide (MICROZIDE) 12.5 MG capsule Take 12.5 mg by mouth daily.   Insulin  Pen Needle (B-D ULTRAFINE  III SHORT PEN) 31G X 8 MM MISC 1 each by Does not apply route as directed. Use to inject insulin  4 times daily   linaclotide  (LINZESS ) 145 MCG CAPS capsule Take 1 capsule (145 mcg total) by mouth daily before breakfast. Samples of this drug were given to the patient, quantity 8, Lot Number [id number], [id number] 01/2023   losartan (COZAAR) 100 MG tablet Take 100 mg by mouth daily.   metoCLOPramide  (REGLAN ) 10 MG tablet Take 1 tablet (10 mg total) by mouth at bedtime.   pregabalin (LYRICA) 100 MG capsule Take 100 mg by mouth 2 (two) times daily.   RESTASIS MULTIDOSE 0.05 % ophthalmic emulsion SMARTSIG:In Eye(s)   sucralfate  (CARAFATE ) 1 g tablet Take 1 tablet before meal at bedtime dissolve tablet in 10 ml warm water to make into slurry   tirzepatide  (MOUNJARO ) 5 MG/0.5ML Pen Inject 5 mg into the skin once a week.   [DISCONTINUED] insulin  glargine (LANTUS  SOLOSTAR) 100 UNIT/ML Solostar Pen Inject 50 Units into the skin at bedtime.   [DISCONTINUED] insulin  lispro (HUMALOG ) 100 UNIT/ML KwikPen Inject 16-22 Units into the skin 3 (three) times daily. INJECT 16-22 UNITS UNDER THE SKIN THREE TIMES DAILY   Blood Glucose Monitoring Suppl  (ACCU-CHEK GUIDE) w/Device KIT 1 Piece by Does not apply route as directed. (Patient not taking: Reported on 12/31/2023)   glucose blood (ACCU-CHEK GUIDE) test strip Use as instructed (Patient not taking: Reported on 12/31/2023)   HYDROcodone-acetaminophen (NORCO/VICODIN) 5-325 MG tablet Take 2 tablets by mouth 2 (two) times daily. (Patient not taking: Reported on 01/13/2023)   insulin  glargine (LANTUS  SOLOSTAR) 100 UNIT/ML Solostar Pen Inject 70 Units into the skin at bedtime.   insulin  lispro (HUMALOG ) 100 UNIT/ML KwikPen Inject 20-26 Units into the skin 3 (three) times daily. INJECT 16-22 UNITS UNDER THE SKIN THREE TIMES DAILY   linaclotide  (LINZESS ) 145 MCG CAPS capsule Take 1 capsule (145 mcg total) by mouth daily before breakfast. (Patient not taking: Reported on 12/31/2023)   mupirocin ointment (BACTROBAN) 2 % Apply topically 2 (two) times daily. (Patient not taking: Reported on 01/13/2023)   polyethylene glycol powder (GLYCOLAX/MIRALAX) 17 GM/SCOOP powder Take 17 g by mouth daily. (Patient not taking: Reported on 12/31/2023)   [DISCONTINUED] tirzepatide  (MOUNJARO ) 7.5 MG/0.5ML Pen Inject 7.5 mg into the skin once a week. (Patient not taking: Reported on 12/31/2023)   [DISCONTINUED] TRESIBA  FLEXTOUCH 100 UNIT/ML FlexTouch Pen ADMINISTER 50 UNITS UNDER THE SKIN AT BEDTIME (Patient not taking: Reported on 12/31/2023)   Facility-Administered Encounter Medications as of 12/31/2023  Medication   0.9 %  sodium chloride  infusion    ALLERGIES: Allergies  Allergen Reactions   Metformin     VACCINATION STATUS: Immunization History  Administered Date(s) Administered   Moderna Sars-Covid-2 Vaccination 11/23/2019, 12/21/2019    Diabetes She presents for her follow-up diabetic visit. She has type 2 diabetes mellitus. Onset time: Diagnosed at approx age of 58. Her disease course has been worsening. Hypoglycemia symptoms include nervousness/anxiousness, sweats and tremors. Associated symptoms include fatigue and  foot paresthesias. Pertinent negatives for diabetes include no blurred vision, no polydipsia, no polyphagia, no polyuria and no weight loss. There are no hypoglycemic complications. Symptoms are stable. Diabetic complications include nephropathy. Risk factors for coronary artery disease include diabetes mellitus, dyslipidemia, family history, obesity, hypertension, sedentary lifestyle and stress. Current diabetic treatment includes intensive insulin  program. She is compliant with treatment most of the time. Her weight is increasing steadily. She is following a generally unhealthy diet. When asked about meal planning, she  reported none. She has not had a previous visit with a dietitian. She rarely participates in exercise. Her home blood glucose trend is increasing rapidly. Her breakfast blood glucose range is generally >200 mg/dl. Her lunch blood glucose range is generally >200 mg/dl. Her dinner blood glucose range is generally >200 mg/dl. Her bedtime blood glucose range is generally >200 mg/dl. Her overall blood glucose range is >200 mg/dl. (She presents today with her CGM showing gross hyperglycemia overall.  Her POCT A1c today is 12.3%, increasing from last visit of 9.5%.  She notes her insurance stopped paying for the Mounjaro .  She also notes she had steroid injection in her back.  Analysis of her CGM shows TIR 11%, TAR 87%, TBR 2% with a GMI of 10.2%.) An ACE inhibitor/angiotensin II receptor blocker is being taken. She does not see a podiatrist.Eye exam is current.  Hyperlipidemia This is a chronic problem. The current episode started more than 1 year ago. The problem is controlled. Recent lipid tests were reviewed and are normal. Exacerbating diseases include chronic renal disease, diabetes and obesity. Factors aggravating her hyperlipidemia include thiazides. She is currently on no antihyperlipidemic treatment. Compliance problems include adherence to diet, adherence to exercise and psychosocial issues.   Risk factors for coronary artery disease include diabetes mellitus, dyslipidemia, hypertension, obesity, a sedentary lifestyle, stress and family history.  Hypertension This is a chronic problem. The current episode started more than 1 year ago. The problem has been gradually improving since onset. The problem is controlled. Associated symptoms include sweats. Pertinent negatives include no blurred vision. There are no associated agents to hypertension. Risk factors for coronary artery disease include diabetes mellitus, dyslipidemia, family history, obesity, sedentary lifestyle and stress. Past treatments include angiotensin blockers and diuretics. The current treatment provides moderate improvement. Compliance problems include diet and exercise.  Hypertensive end-organ damage includes kidney disease. Identifiable causes of hypertension include chronic renal disease.     Review of systems  Constitutional: + stable body weight, current Body mass index is 56.9 kg/m., + fatigue, no subjective hyperthermia, + migraine headache today Eyes: no blurry vision, no xerophthalmia ENT: no sore throat, no nodules palpated in throat, no dysphagia/odynophagia, no hoarseness, + sinus drainage (allergies per patient) Cardiovascular: no chest pain, no shortness of breath, no palpitations, no leg swelling Respiratory: no cough, no shortness of breath Gastrointestinal: no nausea/vomiting/diarrhea Musculoskeletal: + back pain (seeing specialist)- recent steroid injection Skin: no rashes, no hyperemia Neurological: no tremors, + numbness and tingling to B feet, no dizziness Psychiatric: no depression and anxiety  Objective:     BP 124/80 (BP Location: Right Arm, Patient Position: Sitting, Cuff Size: Large)   Pulse 94   Ht 4\' 8"  (1.422 m)   Wt 253 lb 12.8 oz (115.1 kg)   LMP 04/07/2016   BMI 56.90 kg/m   Wt Readings from Last 3 Encounters:  12/31/23 253 lb 12.8 oz (115.1 kg)  08/26/23 253 lb (114.8 kg)   01/13/23 255 lb (115.7 kg)     BP Readings from Last 3 Encounters:  12/31/23 124/80  08/26/23 137/84  01/13/23 126/70       Physical Exam- Limited  Constitutional:  Body mass index is 56.9 kg/m. , not in acute distress, normal state of mind Eyes:  EOMI, no exophthalmos Musculoskeletal: no gross deformities, strength intact in all four extremities, no gross restriction of joint movements Skin:  no rashes, no hyperemia Neurological: no tremor with outstretched hands   Diabetic Foot Exam - Simple   No  data filed     CMP ( most recent) CMP     Component Value Date/Time   NA 137 12/19/2021 1045   NA 140 05/23/2021 0940   K 3.7 12/19/2021 1045   CL 102 12/19/2021 1045   CO2 28 12/19/2021 1045   GLUCOSE 291 (H) 12/19/2021 1045   BUN 12 12/19/2021 1045   BUN 17 05/23/2021 0940   CREATININE 0.97 12/19/2021 1045   CALCIUM 9.6 12/19/2021 1045   PROT 7.1 05/23/2021 0940   ALBUMIN 4.2 05/23/2021 0940   AST 12 05/23/2021 0940   ALT 13 05/23/2021 0940   ALKPHOS 133 (H) 05/23/2021 0940   BILITOT 0.4 05/23/2021 0940   GFRAA 68 12/25/2020 0000     Diabetic Labs (most recent): Lab Results  Component Value Date   HGBA1C 12.3 (A) 12/31/2023   HGBA1C 9.5 (A) 08/26/2023   HGBA1C 11.2 (A) 06/02/2022   MICROALBUR 30 02/27/2022   MICROALBUR 10 02/18/2021     Lipid Panel ( most recent) Lipid Panel     Component Value Date/Time   CHOL 157 05/23/2021 0940   TRIG 109 05/23/2021 0940   HDL 50 05/23/2021 0940   CHOLHDL 3.1 05/23/2021 0940   LDLCALC 87 05/23/2021 0940   LABVLDL 20 05/23/2021 0940      Lab Results  Component Value Date   TSH 3.120 05/23/2021   FREET4 1.12 05/23/2021           Assessment & Plan:   1) Uncontrolled type 2 diabetes mellitus with hyperglycemia (HCC)  She presents today with her CGM showing gross hyperglycemia overall.  Her POCT A1c today is 12.3%, increasing from last visit of 9.5%.  She notes her insurance stopped paying for the  Mounjaro .  She also notes she had steroid injection in her back.  Analysis of her CGM shows TIR 11%, TAR 87%, TBR 2% with a GMI of 10.2%.  - Nicole [name] has currently uncontrolled symptomatic type 2 DM since 58 years of age.  -Recent labs reviewed.  - I had a long discussion with her about the progressive nature of diabetes and the pathology behind its complications. -her diabetes is complicated by CKD stage 2, retinopathy, neuropathy and she remains at a high risk for more acute and chronic complications which include CAD, CVA, CKD, retinopathy, and neuropathy. These are all discussed in detail with her.  - Nutritional counseling repeated at each appointment due to patients tendency to fall back in to old habits.  - The patient admits there is a room for improvement in their diet and drink choices. -  Suggestion is made for the patient to avoid simple carbohydrates from their diet including Cakes, Sweet Desserts / Pastries, Ice Cream, Soda (diet and regular), Sweet Tea, Candies, Chips, Cookies, Sweet Pastries, Store Bought Juices, Alcohol in Excess of 1-2 drinks a day, Artificial Sweeteners, Coffee Creamer, and "Sugar-free" Products. This will help patient to have stable blood glucose profile and potentially avoid unintended weight gain.   - I encouraged the patient to switch to unprocessed or minimally processed complex starch and increased protein intake (animal or plant source), fruits, and vegetables.   - Patient is advised to stick to a routine mealtimes to eat 3 meals a day and avoid unnecessary snacks (to snack only to correct hypoglycemia).  - I have approached her with the following individualized plan to manage her diabetes and patient agrees:   -She is advised to continue Lantus  70 units SQ nightly and Humalog  20-26 units  TID with meals if glucose is above 90 and she is eating (Specific instructions on how to titrate insulin  dosage based on glucose readings given to patient in  writing).  Will retry sending script for Mounjaro  as she was doing well on it, really helping her glucose ranges.  I did give her a sample Mounjaro  2.5 mg box today, in hopes we can get it approved for her.  -she is encouraged to continue monitoring glucose 4 times daily (using her CGM), before meals and before bed, and to call the clinic if she has readings less than 70 or above 300 for 3 tests in a row.  She has had trouble with sensors sticking.  I did give her sample of Skin Tac to try.  - she is warned not to take insulin  without proper monitoring per orders. - Adjustment parameters are given to her for hypo and hyperglycemia in writing.  - Specific targets for  A1c; LDL, HDL, and Triglycerides were discussed with the patient.  2) Blood Pressure /Hypertension:  her blood pressure is controlled to target.   she is advised to continue her current medications as prescribed by PCP.  3) Lipids/Hyperlipidemia:    Review of her recent lipid panel from 05/23/21 showed controlled LDL at 87. She is advised to avoid fried foods and butter.  She notes she had labs in October.  Will try to get results sent over for our records.  4)  Weight/Diet:  her Body mass index is 56.9 kg/m.  -  clearly complicating her diabetes care.   she is a candidate for weight loss. I discussed with her the fact that loss of 5 - 10% of her  current body weight will have the most impact on her diabetes management.  Exercise, and detailed carbohydrates information provided  -  detailed on discharge instructions.  5) Chronic Care/Health Maintenance: -she is on ACEI/ARB and is encouraged to initiate and continue to follow up with Ophthalmology, Dentist, Podiatrist at least yearly or according to recommendations, and advised to stay away from smoking. I have recommended yearly flu vaccine and pneumonia vaccine at least every 5 years; moderate intensity exercise for up to 150 minutes weekly; and sleep for at least 7 hours a  day.  - she is advised to maintain close follow up with Ava Lei, DO for primary care needs, as well as her other providers for optimal and coordinated care.     I spent  41  minutes in the care of the patient today including review of labs from CMP, Lipids, Thyroid  Function, Hematology (current and previous including abstractions from other facilities); face-to-face time discussing  her blood glucose readings/logs, discussing hypoglycemia and hyperglycemia episodes and symptoms, medications doses, her options of short and long term treatment based on the latest standards of care / guidelines;  discussion about incorporating lifestyle medicine;  and documenting the encounter. Risk reduction counseling performed per USPSTF guidelines to reduce obesity and cardiovascular risk factors.     Please refer to Patient Instructions for Blood Glucose Monitoring and Insulin /Medications Dosing Guide"  in media tab for additional information. Please  also refer to " Patient Self Inventory" in the Media  tab for reviewed elements of pertinent patient history.  Nicole [name] participated in the discussions, expressed understanding, and voiced agreement with the above plans.  All questions were answered to her satisfaction. she is encouraged to contact clinic should she have any questions or concerns prior to her return visit.  Follow up plan: - Return in about 3 months (around 04/01/2024) for Diabetes F/U with A1c in office, No previsit labs, Bring meter and logs.   Hulon Magic, [facility] [address] [place], [place] [address] Phone: [phone] Fax: [phone]  12/31/2023, 4:01 PM

## 2024-01-07 DIAGNOSIS — H524 Presbyopia: Secondary | ICD-10-CM | POA: Diagnosis not present

## 2024-01-07 DIAGNOSIS — E113493 Type 2 diabetes mellitus with severe nonproliferative diabetic retinopathy without macular edema, bilateral: Secondary | ICD-10-CM | POA: Diagnosis not present

## 2024-01-08 DIAGNOSIS — G4733 Obstructive sleep apnea (adult) (pediatric): Secondary | ICD-10-CM | POA: Diagnosis not present

## 2024-01-20 ENCOUNTER — Telehealth: Payer: Self-pay | Admitting: Nurse Practitioner

## 2024-01-20 ENCOUNTER — Other Ambulatory Visit (HOSPITAL_COMMUNITY): Payer: Self-pay

## 2024-01-20 NOTE — Telephone Encounter (Signed)
 Pt states she's needing a PA for Mounjaro .  Can you check on this

## 2024-01-23 DIAGNOSIS — M791 Myalgia, unspecified site: Secondary | ICD-10-CM | POA: Diagnosis not present

## 2024-01-25 ENCOUNTER — Other Ambulatory Visit (HOSPITAL_COMMUNITY): Payer: Self-pay

## 2024-01-25 ENCOUNTER — Encounter: Payer: Self-pay | Admitting: Pharmacy Technician

## 2024-01-25 NOTE — Telephone Encounter (Signed)
 PA request has been Submitted. New Encounter has been or will be created for follow up. For additional info see Pharmacy Prior Auth telephone encounter from 01/25/24.

## 2024-01-25 NOTE — Telephone Encounter (Signed)
 Pharmacy Patient Advocate Encounter   Received notification from Pt Calls Messages that prior authorization for Mounjaro  5MG /0.5ML auto-injectors is required/requested.   Insurance verification completed.   The patient is insured through [facility] .   Per test claim: PA required; PA submitted to above mentioned insurance via CoverMyMeds Key/confirmation #/EOC [id number] Status is pending

## 2024-01-26 ENCOUNTER — Other Ambulatory Visit (HOSPITAL_COMMUNITY): Payer: Self-pay

## 2024-01-26 NOTE — Telephone Encounter (Signed)
 Pharmacy Patient Advocate Encounter  Received notification from [employer] that Prior Authorization for Mounjaro  5MG /0.5ML auto-injectors has been CANCELLED due to    PA #/Case ID/Reference #: [id number]  [employer] PA form and chart notes have been faxed. Status pending. Fax: [phone]

## 2024-01-27 ENCOUNTER — Other Ambulatory Visit (HOSPITAL_COMMUNITY): Payer: Self-pay

## 2024-01-28 ENCOUNTER — Other Ambulatory Visit (HOSPITAL_COMMUNITY): Payer: Self-pay

## 2024-02-01 ENCOUNTER — Other Ambulatory Visit (HOSPITAL_COMMUNITY): Payer: Self-pay

## 2024-02-01 NOTE — Telephone Encounter (Signed)
 Noted

## 2024-02-01 NOTE — Telephone Encounter (Signed)
**  Second Attempt Faxing off [employer] PA form.** Fax: [phone]

## 2024-02-02 ENCOUNTER — Other Ambulatory Visit (HOSPITAL_COMMUNITY): Payer: Self-pay

## 2024-02-02 DIAGNOSIS — E113513 Type 2 diabetes mellitus with proliferative diabetic retinopathy with macular edema, bilateral: Secondary | ICD-10-CM | POA: Diagnosis not present

## 2024-02-03 ENCOUNTER — Telehealth: Payer: Self-pay | Admitting: Pharmacy Technician

## 2024-02-03 ENCOUNTER — Other Ambulatory Visit (HOSPITAL_COMMUNITY): Payer: Self-pay

## 2024-02-03 NOTE — Telephone Encounter (Signed)
 error

## 2024-02-03 NOTE — Telephone Encounter (Addendum)
 Mounjaro  PA can not be done on covermymeds. PA form and chart notes were faxed to [employer] on 01/27/24 and again on 02/01/24. Fax: [phone]  Called [employer] today for an update. They were missing additional information. Information has been faxed.

## 2024-02-04 ENCOUNTER — Other Ambulatory Visit (HOSPITAL_COMMUNITY): Payer: Self-pay

## 2024-02-05 ENCOUNTER — Other Ambulatory Visit (HOSPITAL_COMMUNITY): Payer: Self-pay

## 2024-02-05 NOTE — Telephone Encounter (Signed)
 [employer] needed more chart notes again about metformin. Chart notes have been faxed to [phone].

## 2024-02-08 ENCOUNTER — Other Ambulatory Visit (HOSPITAL_COMMUNITY): Payer: Self-pay

## 2024-02-09 ENCOUNTER — Other Ambulatory Visit (HOSPITAL_COMMUNITY): Payer: Self-pay

## 2024-02-09 NOTE — Telephone Encounter (Signed)
 Response from [employer] in regards to the approval letter that was faxed:

## 2024-02-09 NOTE — Telephone Encounter (Signed)
 Patient was called and made aware.

## 2024-02-09 NOTE — Telephone Encounter (Signed)
 Pharmacy Patient Advocate Encounter  Received notification from [employer] that Prior Authorization for MOUNJARO  5 MG/0.5ML Pen has been APPROVED from 02/08/24 to 08/10/24   PA #/Case ID/Reference #: [id number]  **Per approval letter below, it was only approved for a 7 day supply (1 pen). I called [employer] to find out why it wasn't approved for a full box. The representative stated that I had to email the clinical part of [employer] to find out why.  Email has been sent to: [email or web address] Waiting for a response back.  Full approval letter will be uploaded under the media tab.**

## 2024-02-15 ENCOUNTER — Other Ambulatory Visit: Payer: Self-pay | Admitting: *Deleted

## 2024-02-15 DIAGNOSIS — Z794 Long term (current) use of insulin: Secondary | ICD-10-CM

## 2024-02-15 DIAGNOSIS — E1165 Type 2 diabetes mellitus with hyperglycemia: Secondary | ICD-10-CM

## 2024-02-15 DIAGNOSIS — Z7985 Long-term (current) use of injectable non-insulin antidiabetic drugs: Secondary | ICD-10-CM

## 2024-02-15 MED ORDER — TIRZEPATIDE 5 MG/0.5ML ~~LOC~~ SOAJ: 5.0000 mg | SUBCUTANEOUS | 1 refills | Status: DC

## 2024-02-15 NOTE — Telephone Encounter (Signed)
 Patient called and left a message asking that we sent the prescription to [facility] in [place], [place]. As [facility] is now out of network. This has been done.

## 2024-03-04 DIAGNOSIS — M9902 Segmental and somatic dysfunction of thoracic region: Secondary | ICD-10-CM | POA: Diagnosis not present

## 2024-03-04 DIAGNOSIS — M62830 Muscle spasm of back: Secondary | ICD-10-CM | POA: Diagnosis not present

## 2024-03-04 DIAGNOSIS — M9903 Segmental and somatic dysfunction of lumbar region: Secondary | ICD-10-CM | POA: Diagnosis not present

## 2024-03-04 DIAGNOSIS — M546 Pain in thoracic spine: Secondary | ICD-10-CM | POA: Diagnosis not present

## 2024-03-08 DIAGNOSIS — E113512 Type 2 diabetes mellitus with proliferative diabetic retinopathy with macular edema, left eye: Secondary | ICD-10-CM | POA: Diagnosis not present

## 2024-03-21 DIAGNOSIS — G4733 Obstructive sleep apnea (adult) (pediatric): Secondary | ICD-10-CM | POA: Diagnosis not present

## 2024-03-25 DIAGNOSIS — G4733 Obstructive sleep apnea (adult) (pediatric): Secondary | ICD-10-CM | POA: Diagnosis not present

## 2024-03-25 DIAGNOSIS — G479 Sleep disorder, unspecified: Secondary | ICD-10-CM | POA: Diagnosis not present

## 2024-04-21 ENCOUNTER — Ambulatory Visit (INDEPENDENT_AMBULATORY_CARE_PROVIDER_SITE_OTHER): Admitting: Orthopaedic Surgery

## 2024-04-21 ENCOUNTER — Other Ambulatory Visit (HOSPITAL_BASED_OUTPATIENT_CLINIC_OR_DEPARTMENT_OTHER): Payer: Self-pay

## 2024-04-21 ENCOUNTER — Ambulatory Visit (HOSPITAL_BASED_OUTPATIENT_CLINIC_OR_DEPARTMENT_OTHER)

## 2024-04-21 DIAGNOSIS — M5442 Lumbago with sciatica, left side: Secondary | ICD-10-CM

## 2024-04-21 DIAGNOSIS — M5441 Lumbago with sciatica, right side: Secondary | ICD-10-CM

## 2024-04-21 DIAGNOSIS — G8929 Other chronic pain: Secondary | ICD-10-CM

## 2024-04-21 DIAGNOSIS — M47816 Spondylosis without myelopathy or radiculopathy, lumbar region: Secondary | ICD-10-CM | POA: Diagnosis not present

## 2024-04-21 MED ORDER — METHOCARBAMOL 500 MG PO TABS
500.0000 mg | ORAL_TABLET | Freq: Two times a day (BID) | ORAL | 4 refills | Status: AC
  Filled 2024-04-21 (×2): qty 30, 15d supply, fill #0

## 2024-04-21 NOTE — Progress Notes (Signed)
 Chief Complaint: Radiating lower back pain     History of Present Illness:    Nicole [name] is a 58 y.o. female presents today with ongoing lower back pain which she has had for multiple years but has been progressively worse over the course of the last year.  She does work in Dentist.  She does have a job predominantly sitting but does experience pain with most activities including sitting and standing.  She has previously had 2 epidural steroid injections which the first which gave her extremely good relief but the second was less effective.  She has participated in physical therapy in the past.    PMH/PSH/Family History/Social History/Meds/Allergies:    Past Medical History:  Diagnosis Date   Depression    Diabetes mellitus    Fibromyalgia    GERD (gastroesophageal reflux disease)    Hiatal hernia    HTN (hypertension)    Obesity    RLS (restless legs syndrome)    Sleep apnea    Vitamin D  deficiency    Vitamin D  deficiency    Past Surgical History:  Procedure Laterality Date   CATARACT EXTRACTION     CESAREAN SECTION     TUBAL LIGATION     Social History   Socioeconomic History   Marital status: Married    Spouse name: Not on file   Number of children: Not on file   Years of education: MBA   Highest education level: Not on file  Occupational History   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  Tobacco Use   Smoking status: Never   Smokeless tobacco: Never  Vaping Use   Vaping status: Never Used  Substance and Sexual Activity   Alcohol use: No   Drug use: No   Sexual activity: Not on file  Other Topics Concern   Not on file  Social History Narrative   Not on file   Social Drivers of Health   Financial Resource Strain: Not on file  Food Insecurity: Not on file  Transportation Needs: Not on file  Physical Activity: Not on file  Stress: Not on file  Social Connections: Not on file   Family History  Problem Relation Age of  Onset   Arthritis Mother    Heart disease Other    Cancer Other    Asthma Other    Diabetes Other    Rectal cancer Neg Hx    Esophageal cancer Neg Hx    Stomach cancer Neg Hx    Allergies  Allergen Reactions   Metformin    Current Outpatient Medications  Medication Sig Dispense Refill   methocarbamol  (ROBAXIN ) 500 MG tablet Take 1 tablet (500 mg total) by mouth in the morning and at bedtime. 30 tablet 4   amLODipine (NORVASC) 5 MG tablet Take 5 mg by mouth daily.     aspirin EC 81 MG tablet Take 81 mg by mouth daily.     Baclofen 5 MG TABS Take 1 tablet by mouth daily.     Blood Glucose Monitoring Suppl (ACCU-CHEK GUIDE) w/Device KIT 1 Piece by Does not apply route as directed. (Patient not taking: Reported on 12/31/2023) 1 kit 0   celecoxib (CELEBREX) 200 MG capsule Take 200 mg by mouth daily.     Cholecalciferol 125 MCG (5000 UT) TABS Take 5,000 Units by mouth daily.     Continuous Glucose Sensor (FREESTYLE LIBRE 3 PLUS SENSOR) MISC Change sensor every 15 days. 6 each 3  cyclobenzaprine (FLEXERIL) 5 MG tablet Take 5 mg by mouth 3 (three) times daily as needed.     dexlansoprazole  (DEXILANT ) 60 MG capsule Take 1 capsule (60 mg total) by mouth daily. 90 capsule 3   Docusate Calcium (STOOL SOFTENER PO) Take 2 tablets by mouth daily.     DULoxetine (CYMBALTA) 60 MG capsule Take 60 mg by mouth daily.     glucose blood (ACCU-CHEK GUIDE) test strip Use as instructed (Patient not taking: Reported on 12/31/2023) 50 each 12   hydrochlorothiazide (MICROZIDE) 12.5 MG capsule Take 12.5 mg by mouth daily.     HYDROcodone-acetaminophen (NORCO/VICODIN) 5-325 MG tablet Take 2 tablets by mouth 2 (two) times daily. (Patient not taking: Reported on 01/13/2023)     insulin  glargine (LANTUS  SOLOSTAR) 100 UNIT/ML Solostar Pen Inject 70 Units into the skin at bedtime. 75 mL 3   insulin  lispro (HUMALOG ) 100 UNIT/ML KwikPen Inject 20-26 Units into the skin 3 (three) times daily. INJECT 16-22 UNITS UNDER THE SKIN  THREE TIMES DAILY 75 mL 3   Insulin  Pen Needle (B-D ULTRAFINE III SHORT PEN) 31G X 8 MM MISC 1 each by Does not apply route as directed. Use to inject insulin  4 times daily 1000 each 3   linaclotide  (LINZESS ) 145 MCG CAPS capsule Take 1 capsule (145 mcg total) by mouth daily before breakfast. Samples of this drug were given to the patient, quantity 8, Lot Number [id number], [id number] 01/2023 8 capsule 0   linaclotide  (LINZESS ) 145 MCG CAPS capsule Take 1 capsule (145 mcg total) by mouth daily before breakfast. (Patient not taking: Reported on 12/31/2023) 90 capsule 3   losartan (COZAAR) 100 MG tablet Take 100 mg by mouth daily.     metoCLOPramide  (REGLAN ) 10 MG tablet Take 1 tablet (10 mg total) by mouth at bedtime. 90 tablet 3   mupirocin ointment (BACTROBAN) 2 % Apply topically 2 (two) times daily. (Patient not taking: Reported on 01/13/2023)     polyethylene glycol powder (GLYCOLAX/MIRALAX) 17 GM/SCOOP powder Take 17 g by mouth daily. (Patient not taking: Reported on 12/31/2023)     pregabalin (LYRICA) 100 MG capsule Take 100 mg by mouth 2 (two) times daily.     RESTASIS MULTIDOSE 0.05 % ophthalmic emulsion SMARTSIG:In Eye(s)     sucralfate  (CARAFATE ) 1 g tablet Take 1 tablet before meal at bedtime dissolve tablet in 10 ml warm water to make into slurry 90 tablet 3   tirzepatide  (MOUNJARO ) 5 MG/0.5ML Pen Inject 5 mg into the skin once a week. 6 mL 1   Current Facility-Administered Medications  Medication Dose Route Frequency Provider Last Rate Last Admin   0.9 %  sodium chloride  infusion  500 mL Intravenous Once Abran Norleen SAILOR, MD       No results found.  Review of Systems:   A ROS was performed including pertinent positives and negatives as documented in the HPI.  Physical Exam :   Constitutional: NAD and appears stated age Neurological: Alert and oriented Psych: Appropriate affect and cooperative Last menstrual period 04/07/2016.   Comprehensive Musculoskeletal Exam:    She has pain that does  radiate to left lower extremity and forward flexion of the trunk while sitting.  There is positive straight leg raise on the left.  Remainder of distal neurosensory exams intact with full strength in knee extension   Imaging:   Xray (4 views lumbar spine): There is a small amount of scoliosis about the lumbar spine with some  I personally reviewed and interpreted the  radiographs.   Assessment and Plan:   58 y.o. female with evidence of lumbar stenosis based on her history and physical exam findings today.  I have asked her to drop off her MRIs that are processed to see if there is some component of stenosis that may need to be addressed.  We also plan to refer her for a lumbar epidural injection at today's visit to hopefully get her some relief  -She will drop off her MRI disc and I will review this to decide next steps although likely will plan for referral to Dr. Georgina   I personally saw and evaluated the patient, and participated in the management and treatment plan.  Elspeth Parker, MD Attending Physician, Orthopedic Surgery  This document was dictated using Dragon voice recognition software. A reasonable attempt at proof reading has been made to minimize errors.

## 2024-04-22 ENCOUNTER — Ambulatory Visit: Admitting: Nurse Practitioner

## 2024-04-22 ENCOUNTER — Encounter: Payer: Self-pay | Admitting: Nurse Practitioner

## 2024-04-22 VITALS — BP 138/78 | HR 86 | Ht <= 58 in | Wt 273.4 lb

## 2024-04-22 DIAGNOSIS — E1165 Type 2 diabetes mellitus with hyperglycemia: Secondary | ICD-10-CM | POA: Diagnosis not present

## 2024-04-22 DIAGNOSIS — E782 Mixed hyperlipidemia: Secondary | ICD-10-CM

## 2024-04-22 DIAGNOSIS — I10 Essential (primary) hypertension: Secondary | ICD-10-CM

## 2024-04-22 DIAGNOSIS — Z794 Long term (current) use of insulin: Secondary | ICD-10-CM | POA: Diagnosis not present

## 2024-04-22 DIAGNOSIS — Z7985 Long-term (current) use of injectable non-insulin antidiabetic drugs: Secondary | ICD-10-CM

## 2024-04-22 LAB — POCT GLYCOSYLATED HEMOGLOBIN (HGB A1C): Hemoglobin A1C: 10.5 % — AB (ref 4.0–5.6)

## 2024-04-22 NOTE — Progress Notes (Signed)
 Endocrinology Follow Up Note       04/22/2024, 10:13 AM   Subjective:    Patient ID: Nicole [name], female    DOB: 1965/10/14.  Nicole [name] is being seen in follow up after being seen in consultation for management of currently uncontrolled symptomatic diabetes requested by  Lonna Millman, DO.   Past Medical History:  Diagnosis Date   Depression    Diabetes mellitus    Fibromyalgia    GERD (gastroesophageal reflux disease)    Hiatal hernia    HTN (hypertension)    Obesity    RLS (restless legs syndrome)    Sleep apnea    Vitamin D  deficiency    Vitamin D  deficiency     Past Surgical History:  Procedure Laterality Date   CATARACT EXTRACTION     CESAREAN SECTION     TUBAL LIGATION      Social History   Socioeconomic History   Marital status: Married    Spouse name: Not on file   Number of children: Not on file   Years of education: MBA   Highest education level: Not on file  Occupational History   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  Tobacco Use   Smoking status: Never   Smokeless tobacco: Never  Vaping Use   Vaping status: Never Used  Substance and Sexual Activity   Alcohol use: No   Drug use: No   Sexual activity: Not on file  Other Topics Concern   Not on file  Social History Narrative   Not on file   Social Drivers of Health   Financial Resource Strain: Not on file  Food Insecurity: Not on file  Transportation Needs: Not on file  Physical Activity: Not on file  Stress: Not on file  Social Connections: Not on file    Family History  Problem Relation Age of Onset   Arthritis Mother    Heart disease Other    Cancer Other    Asthma Other    Diabetes Other    Rectal cancer Neg Hx    Esophageal cancer Neg Hx    Stomach cancer Neg Hx     Outpatient Encounter Medications as of 04/22/2024  Medication Sig   amLODipine (NORVASC) 10 MG tablet Take  10 mg by mouth daily.   aspirin EC 81 MG tablet Take 81 mg by mouth daily.   Cholecalciferol 125 MCG (5000 UT) TABS Take 5,000 Units by mouth daily.   Continuous Glucose Sensor (FREESTYLE LIBRE 3 PLUS SENSOR) MISC Change sensor every 15 days.   Docusate Calcium (STOOL SOFTENER PO) Take 2 tablets by mouth daily.   DULoxetine (CYMBALTA) 60 MG capsule Take 60 mg by mouth daily.   hydrochlorothiazide (MICROZIDE) 12.5 MG capsule Take 12.5 mg by mouth daily.   insulin  glargine (LANTUS  SOLOSTAR) 100 UNIT/ML Solostar Pen Inject 70 Units into the skin at bedtime. (Patient taking differently: Inject 60 Units into the skin at bedtime.)   insulin  lispro (HUMALOG ) 100 UNIT/ML KwikPen Inject 20-26 Units into the skin 3 (three) times daily. INJECT 16-22 UNITS UNDER THE SKIN THREE TIMES DAILY   Insulin  Pen Needle (B-D ULTRAFINE III SHORT PEN)  31G X 8 MM MISC 1 each by Does not apply route as directed. Use to inject insulin  4 times daily   linaclotide  (LINZESS ) 145 MCG CAPS capsule Take 1 capsule (145 mcg total) by mouth daily before breakfast. Samples of this drug were given to the patient, quantity 8, Lot Number [id number], [id number] 01/2023   losartan (COZAAR) 100 MG tablet Take 100 mg by mouth daily.   pregabalin (LYRICA) 100 MG capsule Take 100 mg by mouth 2 (two) times daily.   RESTASIS MULTIDOSE 0.05 % ophthalmic emulsion SMARTSIG:In Eye(s)   tirzepatide  (MOUNJARO ) 5 MG/0.5ML Pen Inject 5 mg into the skin once a week.   amLODipine (NORVASC) 5 MG tablet Take 5 mg by mouth daily. (Patient not taking: Reported on 04/22/2024)   Baclofen 5 MG TABS Take 1 tablet by mouth daily. (Patient not taking: Reported on 04/22/2024)   Blood Glucose Monitoring Suppl (ACCU-CHEK GUIDE) w/Device KIT 1 Piece by Does not apply route as directed. (Patient not taking: Reported on 04/22/2024)   celecoxib (CELEBREX) 200 MG capsule Take 200 mg by mouth daily. (Patient not taking: Reported on 04/22/2024)   cyclobenzaprine (FLEXERIL) 5 MG tablet  Take 5 mg by mouth 3 (three) times daily as needed. (Patient not taking: Reported on 04/22/2024)   dexlansoprazole  (DEXILANT ) 60 MG capsule Take 1 capsule (60 mg total) by mouth daily. (Patient not taking: Reported on 04/22/2024)   glucose blood (ACCU-CHEK GUIDE) test strip Use as instructed (Patient not taking: Reported on 04/22/2024)   HYDROcodone-acetaminophen (NORCO/VICODIN) 5-325 MG tablet Take 2 tablets by mouth 2 (two) times daily. (Patient not taking: Reported on 04/22/2024)   linaclotide  (LINZESS ) 145 MCG CAPS capsule Take 1 capsule (145 mcg total) by mouth daily before breakfast. (Patient not taking: Reported on 04/22/2024)   methocarbamol  (ROBAXIN ) 500 MG tablet Take 1 tablet (500 mg total) by mouth in the morning and at bedtime. (Patient not taking: Reported on 04/22/2024)   metoCLOPramide  (REGLAN ) 10 MG tablet Take 1 tablet (10 mg total) by mouth at bedtime. (Patient not taking: Reported on 04/22/2024)   mupirocin ointment (BACTROBAN) 2 % Apply topically 2 (two) times daily. (Patient not taking: Reported on 01/13/2023)   polyethylene glycol powder (GLYCOLAX/MIRALAX) 17 GM/SCOOP powder Take 17 g by mouth daily. (Patient not taking: Reported on 12/31/2023)   sucralfate  (CARAFATE ) 1 g tablet Take 1 tablet before meal at bedtime dissolve tablet in 10 ml warm water to make into slurry (Patient not taking: Reported on 04/22/2024)   Facility-Administered Encounter Medications as of 04/22/2024  Medication   0.9 %  sodium chloride  infusion    ALLERGIES: Allergies  Allergen Reactions   Metformin     VACCINATION STATUS: Immunization History  Administered Date(s) Administered   Moderna Sars-Covid-2 Vaccination 11/23/2019, 12/21/2019    Diabetes She presents for her follow-up diabetic visit. She has type 2 diabetes mellitus. Onset time: Diagnosed at approx age of 68. Her disease course has been improving. There are no hypoglycemic associated symptoms. Associated symptoms include fatigue and foot  paresthesias. Pertinent negatives for diabetes include no polydipsia, no polyphagia, no polyuria and no weight loss. There are no hypoglycemic complications. Symptoms are stable. Diabetic complications include nephropathy and peripheral neuropathy. Risk factors for coronary artery disease include diabetes mellitus, dyslipidemia, family history, obesity, hypertension, sedentary lifestyle and stress. Current diabetic treatment includes intensive insulin  program (and Mounjaro ). She is compliant with treatment most of the time. Her weight is increasing steadily. She is following a generally unhealthy diet. When asked about meal planning,  she reported none. She has not had a previous visit with a dietitian. She rarely (cannot excercise optimally due to back issues) participates in exercise. Her home blood glucose trend is decreasing steadily. Her breakfast blood glucose range is generally >200 mg/dl. Her lunch blood glucose range is generally >200 mg/dl. Her dinner blood glucose range is generally >200 mg/dl. Her bedtime blood glucose range is generally >200 mg/dl. Her overall blood glucose range is >200 mg/dl. (She presents today with her CGM showing improving yet still above target glycemic profile overall.  Her POCT A1c today is 10.5%, improving from last visit of 12.3%.  Analysis of her CGM shows TIR 29%, TAR 70%, TBR 1% with a GMI of 8.9%.  She just recently got approved for her Mounjaro , has only taken 2 doses of the 2.5 mg dose thus far.  She does note some GI upset with this- likely due to her not changing her diet (eating chips).  She notes she had evaluation for her back showing degenerative stenosis and curvature possibly needing steroid treatment or surgery in the future.) An ACE inhibitor/angiotensin II receptor blocker is being taken. She does not see a podiatrist.Eye exam is current.     Review of systems  Constitutional: + increasing body weight, current Body mass index is 61.3 kg/m., + fatigue, no  subjective hyperthermia Eyes: no blurry vision, no xerophthalmia ENT: no sore throat, no nodules palpated in throat, no dysphagia/odynophagia, no hoarseness Cardiovascular: no chest pain, no shortness of breath, no palpitations, + leg swelling-worse in the evenings Respiratory: no cough, no shortness of breath Gastrointestinal: no nausea/vomiting/diarrhea Musculoskeletal: + back pain (seeing specialist)- recent steroid injection- possibly needing surgery in the future Skin: no rashes, no hyperemia Neurological: no tremors, + numbness and tingling to B feet, no dizziness Psychiatric: no depression and anxiety  Objective:     BP 138/78 (BP Location: Left Arm, Patient Position: Sitting, Cuff Size: Large)   Pulse 86   Ht 4' 8 (1.422 m)   Wt 273 lb 6.4 oz (124 kg)   LMP 04/07/2016   BMI 61.30 kg/m   Wt Readings from Last 3 Encounters:  04/22/24 273 lb 6.4 oz (124 kg)  12/31/23 253 lb 12.8 oz (115.1 kg)  08/26/23 253 lb (114.8 kg)     BP Readings from Last 3 Encounters:  04/22/24 138/78  12/31/23 124/80  08/26/23 137/84      Physical Exam- Limited  Constitutional:  Body mass index is 61.3 kg/m. , not in acute distress, normal state of mind Eyes:  EOMI, no exophthalmos Musculoskeletal: no gross deformities, strength intact in all four extremities, no gross restriction of joint movements Skin:  no rashes, no hyperemia Neurological: no tremor with outstretched hands   Diabetic Foot Exam - Simple   No data filed     CMP ( most recent) CMP     Component Value Date/Time   NA 137 12/19/2021 1045   NA 140 05/23/2021 0940   K 3.7 12/19/2021 1045   CL 102 12/19/2021 1045   CO2 28 12/19/2021 1045   GLUCOSE 291 (H) 12/19/2021 1045   BUN 12 12/19/2021 1045   BUN 17 05/23/2021 0940   CREATININE 0.97 12/19/2021 1045   CALCIUM 9.6 12/19/2021 1045   PROT 7.1 05/23/2021 0940   ALBUMIN 4.2 05/23/2021 0940   AST 12 05/23/2021 0940   ALT 13 05/23/2021 0940   ALKPHOS 133 (H)  05/23/2021 0940   BILITOT 0.4 05/23/2021 0940   GFRAA 68 12/25/2020 0000  Diabetic Labs (most recent): Lab Results  Component Value Date   HGBA1C 10.5 (A) 04/22/2024   HGBA1C 12.3 (A) 12/31/2023   HGBA1C 9.5 (A) 08/26/2023   MICROALBUR 30 02/27/2022   MICROALBUR 10 02/18/2021     Lipid Panel ( most recent) Lipid Panel     Component Value Date/Time   CHOL 157 05/23/2021 0940   TRIG 109 05/23/2021 0940   HDL 50 05/23/2021 0940   CHOLHDL 3.1 05/23/2021 0940   LDLCALC 87 05/23/2021 0940   LABVLDL 20 05/23/2021 0940      Lab Results  Component Value Date   TSH 3.120 05/23/2021   FREET4 1.12 05/23/2021           Assessment & Plan:   1) Uncontrolled type 2 diabetes mellitus with hyperglycemia (HCC)  She presents today with her CGM showing improving yet still above target glycemic profile overall.  Her POCT A1c today is 10.5%, improving from last visit of 12.3%.  Analysis of her CGM shows TIR 29%, TAR 70%, TBR 1% with a GMI of 8.9%.  She just recently got approved for her Mounjaro , has only taken 2 doses of the 2.5 mg dose thus far.  She does note some GI upset with this- likely due to her not changing her diet (eating chips).  She notes she had evaluation for her back showing degenerative stenosis and curvature possibly needing steroid treatment or surgery in the future.  - Nicole [name] has currently uncontrolled symptomatic type 2 DM since 58 years of age.  -Recent labs reviewed.  - I had a long discussion with her about the progressive nature of diabetes and the pathology behind its complications. -her diabetes is complicated by CKD stage 2, retinopathy, neuropathy and she remains at a high risk for more acute and chronic complications which include CAD, CVA, CKD, retinopathy, and neuropathy. These are all discussed in detail with her.  - Nutritional counseling repeated at each appointment due to patients tendency to fall back in to old habits.  - The patient  admits there is a room for improvement in their diet and drink choices. -  Suggestion is made for the patient to avoid simple carbohydrates from their diet including Cakes, Sweet Desserts / Pastries, Ice Cream, Soda (diet and regular), Sweet Tea, Candies, Chips, Cookies, Sweet Pastries, Store Bought Juices, Alcohol in Excess of 1-2 drinks a day, Artificial Sweeteners, Coffee Creamer, and Sugar-free Products. This will help patient to have stable blood glucose profile and potentially avoid unintended weight gain.   - I encouraged the patient to switch to unprocessed or minimally processed complex starch and increased protein intake (animal or plant source), fruits, and vegetables.   - Patient is advised to stick to a routine mealtimes to eat 3 meals a day and avoid unnecessary snacks (to snack only to correct hypoglycemia).  - I have approached her with the following individualized plan to manage her diabetes and patient agrees:   -She is advised to continue Lantus  60 units SQ nightly and Humalog  20-26 units TID with meals if glucose is above 90 and she is eating (Specific instructions on how to titrate insulin  dosage based on glucose readings given to patient in writing).  She is advised to finish out her sample pack of Mounjaro  2.5 mg and bump up to the 5 mg SQ weekly.  We went over dietary adjustments to help prevent GI upset while on this medication.  She is aware that it can take 6 weeks for her  to see the full effect this medication will have on both weight and sugars.  -she is encouraged to continue monitoring glucose 4 times daily (using her CGM), before meals and before bed, and to call the clinic if she has readings less than 70 or above 300 for 3 tests in a row.    - she is warned not to take insulin  without proper monitoring per orders. - Adjustment parameters are given to her for hypo and hyperglycemia in writing.  - Specific targets for  A1c; LDL, HDL, and Triglycerides were discussed  with the patient.  2) Blood Pressure /Hypertension:  her blood pressure is controlled to target.   she is advised to continue her current medications as prescribed by PCP.  3) Lipids/Hyperlipidemia:    Review of her recent lipid panel from 05/23/21 showed controlled LDL at 87. She is advised to avoid fried foods and butter.    4)  Weight/Diet:  her Body mass index is 61.3 kg/m.  -  clearly complicating her diabetes care.   she is a candidate for weight loss. I discussed with her the fact that loss of 5 - 10% of her  current body weight will have the most impact on her diabetes management.  Exercise, and detailed carbohydrates information provided  -  detailed on discharge instructions.  5) Chronic Care/Health Maintenance: -she is on ACEI/ARB and is encouraged to initiate and continue to follow up with Ophthalmology, Dentist, Podiatrist at least yearly or according to recommendations, and advised to stay away from smoking. I have recommended yearly flu vaccine and pneumonia vaccine at least every 5 years; moderate intensity exercise for up to 150 minutes weekly; and sleep for at least 7 hours a day.  - she is advised to maintain close follow up with Lonna Millman, DO for primary care needs, as well as her other providers for optimal and coordinated care.     I spent  34  minutes in the care of the patient today including review of labs from CMP, Lipids, Thyroid  Function, Hematology (current and previous including abstractions from other facilities); face-to-face time discussing  her blood glucose readings/logs, discussing hypoglycemia and hyperglycemia episodes and symptoms, medications doses, her options of short and long term treatment based on the latest standards of care / guidelines;  discussion about incorporating lifestyle medicine;  and documenting the encounter. Risk reduction counseling performed per USPSTF guidelines to reduce obesity and cardiovascular risk factors.     Please refer  to Patient Instructions for Blood Glucose Monitoring and Insulin /Medications Dosing Guide  in media tab for additional information. Please  also refer to  Patient Self Inventory in the Media  tab for reviewed elements of pertinent patient history.  Nicole [name] participated in the discussions, expressed understanding, and voiced agreement with the above plans.  All questions were answered to her satisfaction. she is encouraged to contact clinic should she have any questions or concerns prior to her return visit.     Follow up plan: - Return in about 3 months (around 07/22/2024) for Diabetes F/U with A1c in office, No previsit labs, Bring meter and logs.   Benton Rio, [facility] [address] [place], [place] [address] Phone: [phone] Fax: [phone]  04/22/2024, 10:13 AM

## 2024-05-02 ENCOUNTER — Other Ambulatory Visit: Payer: Self-pay | Admitting: Nurse Practitioner

## 2024-05-20 ENCOUNTER — Encounter (HOSPITAL_BASED_OUTPATIENT_CLINIC_OR_DEPARTMENT_OTHER): Payer: Self-pay | Admitting: Orthopaedic Surgery

## 2024-05-27 ENCOUNTER — Ambulatory Visit (HOSPITAL_BASED_OUTPATIENT_CLINIC_OR_DEPARTMENT_OTHER): Admitting: Orthopaedic Surgery

## 2024-05-27 ENCOUNTER — Other Ambulatory Visit (HOSPITAL_BASED_OUTPATIENT_CLINIC_OR_DEPARTMENT_OTHER): Payer: Self-pay

## 2024-05-27 DIAGNOSIS — M5441 Lumbago with sciatica, right side: Secondary | ICD-10-CM | POA: Diagnosis not present

## 2024-05-27 DIAGNOSIS — M5442 Lumbago with sciatica, left side: Secondary | ICD-10-CM | POA: Diagnosis not present

## 2024-05-27 DIAGNOSIS — G8929 Other chronic pain: Secondary | ICD-10-CM

## 2024-05-27 MED ORDER — CELECOXIB 200 MG PO CAPS: 200.0000 mg | ORAL_CAPSULE | Freq: Two times a day (BID) | ORAL | 2 refills | Status: AC

## 2024-05-27 NOTE — Progress Notes (Signed)
 Chief Complaint: Radiating lower back pain     History of Present Illness:   05/27/2024: Presents today for follow-up of her back pain which has been focally about the cervical spine as well as the lumbar spine today  Nicole [name] is a 58 y.o. female presents today with ongoing lower back pain which she has had for multiple years but has been progressively worse over the course of the last year.  She does work in dentist.  She does have a job predominantly sitting but does experience pain with most activities including sitting and standing.  She has previously had 2 epidural steroid injections which the first which gave her extremely good relief but the second was less effective.  She has participated in physical therapy in the past.    PMH/PSH/Family History/Social History/Meds/Allergies:    Past Medical History:  Diagnosis Date   Depression    Diabetes mellitus    Fibromyalgia    GERD (gastroesophageal reflux disease)    Hiatal hernia    HTN (hypertension)    Obesity    RLS (restless legs syndrome)    Sleep apnea    Vitamin D  deficiency    Vitamin D  deficiency    Past Surgical History:  Procedure Laterality Date   CATARACT EXTRACTION     CESAREAN SECTION     TUBAL LIGATION     Social History   Socioeconomic History   Marital status: Married    Spouse name: Not on file   Number of children: Not on file   Years of education: MBA   Highest education level: Not on file  Occupational History   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  Tobacco Use   Smoking status: Never   Smokeless tobacco: Never  Vaping Use   Vaping status: Never Used  Substance and Sexual Activity   Alcohol use: No   Drug use: No   Sexual activity: Not on file  Other Topics Concern   Not on file  Social History Narrative   Not on file   Social Drivers of Health   Financial Resource Strain: Not on file  Food Insecurity: Not on file  Transportation Needs: Not  on file  Physical Activity: Not on file  Stress: Not on file  Social Connections: Not on file   Family History  Problem Relation Age of Onset   Arthritis Mother    Heart disease Other    Cancer Other    Asthma Other    Diabetes Other    Rectal cancer Neg Hx    Esophageal cancer Neg Hx    Stomach cancer Neg Hx    Allergies  Allergen Reactions   Metformin    Current Outpatient Medications  Medication Sig Dispense Refill   celecoxib (CELEBREX) 200 MG capsule Take 1 capsule (200 mg total) by mouth 2 (two) times daily. 60 capsule 2   amLODipine (NORVASC) 10 MG tablet Take 10 mg by mouth daily.     amLODipine (NORVASC) 5 MG tablet Take 5 mg by mouth daily. (Patient not taking: Reported on 04/22/2024)     aspirin EC 81 MG tablet Take 81 mg by mouth daily.     Baclofen 5 MG TABS Take 1 tablet by mouth daily. (Patient not taking: Reported on 04/22/2024)     Blood Glucose Monitoring Suppl (ACCU-CHEK GUIDE) w/Device KIT 1 Piece by Does not apply route as directed. (Patient not taking: Reported on 04/22/2024) 1 kit 0  celecoxib (CELEBREX) 200 MG capsule Take 200 mg by mouth daily. (Patient not taking: Reported on 04/22/2024)     Cholecalciferol 125 MCG (5000 UT) TABS Take 5,000 Units by mouth daily.     Continuous Glucose Sensor (FREESTYLE LIBRE 3 PLUS SENSOR) MISC Change sensor every 15 days. 6 each 3   cyclobenzaprine (FLEXERIL) 5 MG tablet Take 5 mg by mouth 3 (three) times daily as needed. (Patient not taking: Reported on 04/22/2024)     dexlansoprazole  (DEXILANT ) 60 MG capsule Take 1 capsule (60 mg total) by mouth daily. (Patient not taking: Reported on 04/22/2024) 90 capsule 3   Docusate Calcium (STOOL SOFTENER PO) Take 2 tablets by mouth daily.     DULoxetine (CYMBALTA) 60 MG capsule Take 60 mg by mouth daily.     glucose blood (ACCU-CHEK GUIDE) test strip Use as instructed (Patient not taking: Reported on 04/22/2024) 50 each 12   hydrochlorothiazide (MICROZIDE) 12.5 MG capsule Take 12.5 mg  by mouth daily.     HYDROcodone-acetaminophen (NORCO/VICODIN) 5-325 MG tablet Take 2 tablets by mouth 2 (two) times daily. (Patient not taking: Reported on 04/22/2024)     insulin  glargine (LANTUS  SOLOSTAR) 100 UNIT/ML Solostar Pen Inject 70 Units into the skin at bedtime. (Patient taking differently: Inject 60 Units into the skin at bedtime.) 75 mL 3   insulin  lispro (HUMALOG ) 100 UNIT/ML KwikPen Inject 20-26 Units into the skin 3 (three) times daily. INJECT 16-22 UNITS UNDER THE SKIN THREE TIMES DAILY 75 mL 3   Insulin  Pen Needle (B-D ULTRAFINE III SHORT PEN) 31G X 8 MM MISC 1 each by Does not apply route as directed. Use to inject insulin  4 times daily 1000 each 3   linaclotide  (LINZESS ) 145 MCG CAPS capsule Take 1 capsule (145 mcg total) by mouth daily before breakfast. Samples of this drug were given to the patient, quantity 8, Lot Number [id number], [id number] 01/2023 8 capsule 0   linaclotide  (LINZESS ) 145 MCG CAPS capsule Take 1 capsule (145 mcg total) by mouth daily before breakfast. (Patient not taking: Reported on 04/22/2024) 90 capsule 3   losartan (COZAAR) 100 MG tablet Take 100 mg by mouth daily.     methocarbamol  (ROBAXIN ) 500 MG tablet Take 1 tablet (500 mg total) by mouth in the morning and at bedtime. (Patient not taking: Reported on 04/22/2024) 30 tablet 4   metoCLOPramide  (REGLAN ) 10 MG tablet Take 1 tablet (10 mg total) by mouth at bedtime. (Patient not taking: Reported on 04/22/2024) 90 tablet 3   mupirocin ointment (BACTROBAN) 2 % Apply topically 2 (two) times daily. (Patient not taking: Reported on 01/13/2023)     polyethylene glycol powder (GLYCOLAX/MIRALAX) 17 GM/SCOOP powder Take 17 g by mouth daily. (Patient not taking: Reported on 12/31/2023)     pregabalin (LYRICA) 100 MG capsule Take 100 mg by mouth 2 (two) times daily.     RESTASIS MULTIDOSE 0.05 % ophthalmic emulsion SMARTSIG:In Eye(s)     sucralfate  (CARAFATE ) 1 g tablet Take 1 tablet before meal at bedtime dissolve tablet in 10 ml  warm water to make into slurry (Patient not taking: Reported on 04/22/2024) 90 tablet 3   tirzepatide  (MOUNJARO ) 5 MG/0.5ML Pen Inject 5 mg into the skin once a week. 6 mL 1   Current Facility-Administered Medications  Medication Dose Route Frequency Provider Last Rate Last Admin   0.9 %  sodium chloride  infusion  500 mL Intravenous Once Abran Norleen SAILOR, MD       No results found.  Review of  Systems:   A ROS was performed including pertinent positives and negatives as documented in the HPI.  Physical Exam :   Constitutional: NAD and appears stated age Neurological: Alert and oriented Psych: Appropriate affect and cooperative Last menstrual period 04/07/2016.   Comprehensive Musculoskeletal Exam:    She has pain that does radiate to left lower extremity and forward flexion of the trunk while sitting.  There is positive straight leg raise on the left.  Remainder of distal neurosensory exams intact with full strength in knee extension   Imaging:   Xray (4 views lumbar spine): There is a small amount of scoliosis about the lumbar spine with some  I personally reviewed and interpreted the radiographs.   Assessment and Plan:   58 y.o. female with evidence of lumbar back pain in the setting of essentially normal MRI without evidence of nerve impingement.  We did discuss that there is evidence of dehydration of the L4-L5 as well as L5-S1 disc levels consistent with degenerative disc disease.  Given this I will plan to place a referral for aquatic therapy to get her in the pool.  I would also plan to start her on a prescription for Celebrex to help with her inflammatory type arthritic pain.  - Return to clinic as needed   I personally saw and evaluated the patient, and participated in the management and treatment plan.  Elspeth Parker, MD Attending Physician, Orthopedic Surgery  This document was dictated using Dragon voice recognition software. A reasonable attempt at proof reading has  been made to minimize errors.

## 2024-05-30 ENCOUNTER — Encounter: Payer: Self-pay | Admitting: Radiology

## 2024-06-06 DIAGNOSIS — G479 Sleep disorder, unspecified: Secondary | ICD-10-CM | POA: Diagnosis not present

## 2024-06-06 DIAGNOSIS — G4733 Obstructive sleep apnea (adult) (pediatric): Secondary | ICD-10-CM | POA: Diagnosis not present

## 2024-07-02 ENCOUNTER — Other Ambulatory Visit: Payer: Self-pay | Admitting: Gastroenterology

## 2024-07-05 DIAGNOSIS — J019 Acute sinusitis, unspecified: Secondary | ICD-10-CM | POA: Diagnosis not present

## 2024-07-05 DIAGNOSIS — Z6841 Body Mass Index (BMI) 40.0 and over, adult: Secondary | ICD-10-CM | POA: Diagnosis not present

## 2024-07-15 ENCOUNTER — Telehealth: Payer: Self-pay

## 2024-07-15 NOTE — Telephone Encounter (Signed)
 Patient was identified as falling into the [facility] [place] Measure - Diabetes.   Patient was: Appointment already scheduled for:  12/31 with endo.

## 2024-07-27 ENCOUNTER — Ambulatory Visit (INDEPENDENT_AMBULATORY_CARE_PROVIDER_SITE_OTHER): Admitting: Nurse Practitioner

## 2024-07-27 ENCOUNTER — Encounter: Payer: Self-pay | Admitting: Nurse Practitioner

## 2024-07-27 VITALS — BP 118/78 | HR 86 | Ht <= 58 in | Wt 269.2 lb

## 2024-07-27 DIAGNOSIS — E782 Mixed hyperlipidemia: Secondary | ICD-10-CM

## 2024-07-27 DIAGNOSIS — Z794 Long term (current) use of insulin: Secondary | ICD-10-CM | POA: Diagnosis not present

## 2024-07-27 DIAGNOSIS — Z7985 Long-term (current) use of injectable non-insulin antidiabetic drugs: Secondary | ICD-10-CM | POA: Diagnosis not present

## 2024-07-27 DIAGNOSIS — I10 Essential (primary) hypertension: Secondary | ICD-10-CM | POA: Diagnosis not present

## 2024-07-27 DIAGNOSIS — E1165 Type 2 diabetes mellitus with hyperglycemia: Secondary | ICD-10-CM | POA: Diagnosis not present

## 2024-07-27 LAB — POCT GLYCOSYLATED HEMOGLOBIN (HGB A1C): Hemoglobin A1C: 12.2 % — AB (ref 4.0–5.6)

## 2024-07-27 MED ORDER — FREESTYLE LIBRE 3 PLUS SENSOR MISC: 3 refills | Status: AC

## 2024-07-27 MED ORDER — TIRZEPATIDE 2.5 MG/0.5ML ~~LOC~~ SOAJ: 2.5000 mg | SUBCUTANEOUS | 0 refills | Status: DC

## 2024-07-27 MED ORDER — TIRZEPATIDE 5 MG/0.5ML ~~LOC~~ SOAJ: 5.0000 mg | SUBCUTANEOUS | 1 refills | Status: DC

## 2024-07-27 MED ORDER — PEN NEEDLES 31G X 8 MM MISC: 3 refills | Status: AC

## 2024-07-27 MED ORDER — LANTUS SOLOSTAR 100 UNIT/ML ~~LOC~~ SOPN: 60.0000 [IU] | PEN_INJECTOR | Freq: Every day | SUBCUTANEOUS | 1 refills | Status: AC

## 2024-07-27 MED ORDER — INSULIN LISPRO (1 UNIT DIAL) 100 UNIT/ML (KWIKPEN): 20.0000 [IU] | PEN_INJECTOR | Freq: Three times a day (TID) | SUBCUTANEOUS | 3 refills | Status: AC

## 2024-07-27 NOTE — Progress Notes (Signed)
 "                                                                        Endocrinology Follow Up Note       07/27/2024, 10:31 AM   Subjective:    Patient ID: Nicole [name], female    DOB: 1965-12-06.  Nicole [name] is being seen in follow up after being seen in consultation for management of currently uncontrolled symptomatic diabetes requested by  Lonna Millman, DO.   Past Medical History:  Diagnosis Date   Depression    Diabetes mellitus    Fibromyalgia    GERD (gastroesophageal reflux disease)    Hiatal hernia    HTN (hypertension)    Obesity    RLS (restless legs syndrome)    Sleep apnea    Vitamin D  deficiency    Vitamin D  deficiency     Past Surgical History:  Procedure Laterality Date   CATARACT EXTRACTION     CESAREAN SECTION     TUBAL LIGATION      Social History   Socioeconomic History   Marital status: Married    Spouse name: Not on file   Number of children: Not on file   Years of education: MBA   Highest education level: Not on file  Occupational History   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  Tobacco Use   Smoking status: Never   Smokeless tobacco: Never  Vaping Use   Vaping status: Never Used  Substance and Sexual Activity   Alcohol use: No   Drug use: No   Sexual activity: Not on file  Other Topics Concern   Not on file  Social History Narrative   Not on file   Social Drivers of Health   Tobacco Use: Low Risk (07/27/2024)   Patient History    Smoking Tobacco Use: Never    Smokeless Tobacco Use: Never    Passive Exposure: Not on file  Financial Resource Strain: Not on file  Food Insecurity: Not on file  Transportation Needs: Not on file  Physical Activity: Not on file  Stress: Not on file  Social Connections: Not on file  Depression (EYV7-0): Not on file  Alcohol Screen: Not on file  Housing: Not on file  Utilities: Not on file  Health Literacy: Not on file    Family History  Problem Relation Age of  Onset   Arthritis Mother    Heart disease Other    Cancer Other    Asthma Other    Diabetes Other    Rectal cancer Neg Hx    Esophageal cancer Neg Hx    Stomach cancer Neg Hx     Outpatient Encounter Medications as of 07/27/2024  Medication Sig   amLODipine (NORVASC) 10 MG tablet Take 10 mg by mouth daily.   aspirin EC 81 MG tablet Take 81 mg by mouth daily.   Blood Glucose Monitoring Suppl (ACCU-CHEK GUIDE) w/Device KIT 1 Piece by Does not apply route as directed.   celecoxib  (CELEBREX ) 200 MG capsule Take 1 capsule (200 mg total) by mouth 2 (two) times daily.   Cholecalciferol 125 MCG (5000 UT) TABS Take 5,000 Units by mouth daily.   Continuous Glucose  Sensor (FREESTYLE LIBRE 3 PLUS SENSOR) MISC Change sensor every 15 days.   dexlansoprazole  (DEXILANT ) 60 MG capsule Take 1 capsule (60 mg total) by mouth daily.   Docusate Calcium (STOOL SOFTENER PO) Take 2 tablets by mouth daily.   DULoxetine (CYMBALTA) 60 MG capsule Take 60 mg by mouth daily.   glucose blood (ACCU-CHEK GUIDE) test strip Use as instructed   hydrochlorothiazide (MICROZIDE) 12.5 MG capsule Take 12.5 mg by mouth daily.   Insulin  Pen Needle (PEN NEEDLES) 31G X 8 MM MISC Dispense based on patient and insurance preference. Use up to four times daily as directed. (FOR ICD-10 E10.9, E11.9).   linaclotide  (LINZESS ) 145 MCG CAPS capsule Take 1 capsule (145 mcg total) by mouth daily before breakfast. Samples of this drug were given to the patient, quantity 8, Lot Number [id number], [id number] 01/2023   losartan (COZAAR) 100 MG tablet Take 100 mg by mouth daily.   pregabalin (LYRICA) 100 MG capsule Take 100 mg by mouth 2 (two) times daily.   RESTASIS MULTIDOSE 0.05 % ophthalmic emulsion SMARTSIG:In Eye(s)   tirzepatide  (MOUNJARO ) 2.5 MG/0.5ML Pen Inject 2.5 mg into the skin once a week.   [START ON 08/24/2024] tirzepatide  (MOUNJARO ) 5 MG/0.5ML Pen Inject 5 mg into the skin once a week.   [DISCONTINUED] insulin  glargine (LANTUS  SOLOSTAR)  100 UNIT/ML Solostar Pen Inject 70 Units into the skin at bedtime. (Patient taking differently: Inject 60 Units into the skin at bedtime.)   [DISCONTINUED] insulin  lispro (HUMALOG ) 100 UNIT/ML KwikPen Inject 20-26 Units into the skin 3 (three) times daily. INJECT 16-22 UNITS UNDER THE SKIN THREE TIMES DAILY   [DISCONTINUED] Insulin  Pen Needle (B-D ULTRAFINE III SHORT PEN) 31G X 8 MM MISC 1 each by Does not apply route as directed. Use to inject insulin  4 times daily   celecoxib  (CELEBREX ) 200 MG capsule Take 200 mg by mouth daily. (Patient not taking: Reported on 07/27/2024)   Continuous Glucose Sensor (FREESTYLE LIBRE 3 PLUS SENSOR) MISC Change sensor every 15 days. (Patient not taking: Reported on 07/27/2024)   insulin  glargine (LANTUS  SOLOSTAR) 100 UNIT/ML Solostar Pen Inject 60 Units into the skin at bedtime.   insulin  lispro (HUMALOG ) 100 UNIT/ML KwikPen Inject 20-26 Units into the skin 3 (three) times daily. INJECT 16-22 UNITS UNDER THE SKIN THREE TIMES DAILY   methocarbamol  (ROBAXIN ) 500 MG tablet Take 1 tablet (500 mg total) by mouth in the morning and at bedtime. (Patient not taking: Reported on 07/27/2024)   polyethylene glycol powder (GLYCOLAX/MIRALAX) 17 GM/SCOOP powder Take 17 g by mouth daily. (Patient not taking: Reported on 07/27/2024)   tirzepatide  (MOUNJARO ) 5 MG/0.5ML Pen Inject 5 mg into the skin once a week. (Patient not taking: Reported on 07/27/2024)   [DISCONTINUED] amLODipine (NORVASC) 5 MG tablet Take 5 mg by mouth daily. (Patient not taking: Reported on 07/27/2024)   [DISCONTINUED] Baclofen 5 MG TABS Take 1 tablet by mouth daily. (Patient not taking: Reported on 07/27/2024)   [DISCONTINUED] cyclobenzaprine (FLEXERIL) 5 MG tablet Take 5 mg by mouth 3 (three) times daily as needed. (Patient not taking: Reported on 07/27/2024)   [DISCONTINUED] HYDROcodone-acetaminophen (NORCO/VICODIN) 5-325 MG tablet Take 2 tablets by mouth 2 (two) times daily. (Patient not taking: Reported on  07/27/2024)   [DISCONTINUED] linaclotide  (LINZESS ) 145 MCG CAPS capsule Take 1 capsule (145 mcg total) by mouth daily before breakfast. (Patient not taking: Reported on 07/27/2024)   [DISCONTINUED] metoCLOPramide  (REGLAN ) 10 MG tablet Take 1 tablet (10 mg total) by mouth at bedtime. (Patient not taking:  Reported on 07/27/2024)   [DISCONTINUED] mupirocin ointment (BACTROBAN) 2 % Apply topically 2 (two) times daily. (Patient not taking: Reported on 07/27/2024)   [DISCONTINUED] sucralfate  (CARAFATE ) 1 g tablet Take 1 tablet before meal at bedtime dissolve tablet in 10 ml warm water to make into slurry (Patient not taking: Reported on 07/27/2024)   Facility-Administered Encounter Medications as of 07/27/2024  Medication   0.9 %  sodium chloride  infusion    ALLERGIES: Allergies  Allergen Reactions   Metformin     VACCINATION STATUS: Immunization History  Administered Date(s) Administered   Moderna Sars-Covid-2 Vaccination 11/23/2019, 12/21/2019    Diabetes She presents for her follow-up diabetic visit. She has type 2 diabetes mellitus. Onset time: Diagnosed at approx age of 57. Her disease course has been worsening. There are no hypoglycemic associated symptoms. Associated symptoms include fatigue and foot paresthesias. Pertinent negatives for diabetes include no polydipsia, no polyphagia, no polyuria and no weight loss. There are no hypoglycemic complications. Symptoms are stable. Diabetic complications include nephropathy and peripheral neuropathy. Risk factors for coronary artery disease include diabetes mellitus, dyslipidemia, family history, obesity, hypertension, sedentary lifestyle and stress. Current diabetic treatment includes intensive insulin  program. She is compliant with treatment most of the time. Her weight is increasing steadily. She is following a generally unhealthy diet. When asked about meal planning, she reported none. She has not had a previous visit with a dietitian. She  rarely (cannot excercise optimally due to back issues) participates in exercise. Her breakfast blood glucose range is generally >200 mg/dl. Her lunch blood glucose range is generally >200 mg/dl. Her dinner blood glucose range is generally >200 mg/dl. Her bedtime blood glucose range is generally >200 mg/dl. Her overall blood glucose range is >200 mg/dl. (She presents today with no meter or logs to review.  She notes CGM was denied by her insurance but we have no documentation of this.  She recently had A1c with PCP on 12/26 which was 12.2%, increasing from last visit of 10.5%.  She has not been able to get her Mounjaro  from the pharmacy either, stating insurance wont approve it.  She does have a meter at home but has not been checking glucose.  She has been taking the max dose of Humalog  at each meal.) An ACE inhibitor/angiotensin II receptor blocker is being taken. She does not see a podiatrist.Eye exam is current.     Review of systems  Constitutional: + decreasing body weight, current Body mass index is 60.35 kg/m., + fatigue, no subjective hyperthermia Eyes: no blurry vision, no xerophthalmia ENT: no sore throat, no nodules palpated in throat, no dysphagia/odynophagia, no hoarseness Cardiovascular: no chest pain, no shortness of breath, no palpitations, + leg swelling-worse in the evenings Respiratory: no cough, no shortness of breath Gastrointestinal: no nausea/vomiting/diarrhea Musculoskeletal: + back pain (seeing specialist) Skin: no rashes, no hyperemia Neurological: no tremors, + numbness and tingling to B feet, no dizziness Psychiatric: no depression and anxiety  Objective:     BP 118/78 (BP Location: Right Arm, Patient Position: Sitting, Cuff Size: Large)   Pulse 86   Ht 4' 8 (1.422 m)   Wt 269 lb 3.2 oz (122.1 kg)   LMP 04/07/2016   BMI 60.35 kg/m   Wt Readings from Last 3 Encounters:  07/27/24 269 lb 3.2 oz (122.1 kg)  04/22/24 273 lb 6.4 oz (124 kg)  12/31/23 253 lb 12.8  oz (115.1 kg)     BP Readings from Last 3 Encounters:  07/27/24 118/78  04/22/24 138/78  12/31/23 124/80      Physical Exam- Limited  Constitutional:  Body mass index is 60.35 kg/m. , not in acute distress, normal state of mind Eyes:  EOMI, no exophthalmos Musculoskeletal: no gross deformities, strength intact in all four extremities, no gross restriction of joint movements Skin:  no rashes, no hyperemia Neurological: no tremor with outstretched hands   Diabetic Foot Exam - Simple   No data filed     CMP ( most recent) CMP     Component Value Date/Time   NA 137 12/19/2021 1045   NA 140 05/23/2021 0940   K 3.7 12/19/2021 1045   CL 102 12/19/2021 1045   CO2 28 12/19/2021 1045   GLUCOSE 291 (H) 12/19/2021 1045   BUN 12 12/19/2021 1045   BUN 17 05/23/2021 0940   CREATININE 0.97 12/19/2021 1045   CALCIUM 9.6 12/19/2021 1045   PROT 7.1 05/23/2021 0940   ALBUMIN 4.2 05/23/2021 0940   AST 12 05/23/2021 0940   ALT 13 05/23/2021 0940   ALKPHOS 133 (H) 05/23/2021 0940   BILITOT 0.4 05/23/2021 0940   GFRAA 68 12/25/2020 0000     Diabetic Labs (most recent): Lab Results  Component Value Date   HGBA1C 12.2 (A) 07/27/2024   HGBA1C 10.5 (A) 04/22/2024   HGBA1C 12.3 (A) 12/31/2023   MICROALBUR 30 02/27/2022   MICROALBUR 10 02/18/2021     Lipid Panel ( most recent) Lipid Panel     Component Value Date/Time   CHOL 157 05/23/2021 0940   TRIG 109 05/23/2021 0940   HDL 50 05/23/2021 0940   CHOLHDL 3.1 05/23/2021 0940   LDLCALC 87 05/23/2021 0940   LABVLDL 20 05/23/2021 0940      Lab Results  Component Value Date   TSH 3.120 05/23/2021   FREET4 1.12 05/23/2021           Assessment & Plan:   1) Uncontrolled type 2 diabetes mellitus with hyperglycemia (HCC)  She presents today with no meter or logs to review.  She notes CGM was denied by her insurance but we have no documentation of this.  She recently had A1c with PCP on 12/26 which was 12.2%, increasing  from last visit of 10.5%.  She has not been able to get her Mounjaro  from the pharmacy either, stating insurance wont approve it.  She does have a meter at home but has not been checking glucose.  She has been taking the max dose of Humalog  at each meal.  - Nicole [name] has currently uncontrolled symptomatic type 2 DM since 58 years of age.  -Recent labs reviewed.  - I had a long discussion with her about the progressive nature of diabetes and the pathology behind its complications. -her diabetes is complicated by CKD stage 2, retinopathy, neuropathy and she remains at a high risk for more acute and chronic complications which include CAD, CVA, CKD, retinopathy, and neuropathy. These are all discussed in detail with her.  - Nutritional counseling repeated/built upon at each appointment.  - The patient admits there is a room for improvement in their diet and drink choices. -  Suggestion is made for the patient to avoid simple carbohydrates from their diet including Cakes, Sweet Desserts / Pastries, Ice Cream, Soda (diet and regular), Sweet Tea, Candies, Chips, Cookies, Sweet Pastries, Store Bought Juices, Alcohol in Excess of 1-2 drinks a day, Artificial Sweeteners, Coffee Creamer, and Sugar-free Products. This will help patient to have stable blood glucose profile and potentially avoid unintended weight  gain.   - I encouraged the patient to switch to unprocessed or minimally processed complex starch and increased protein intake (animal or plant source), fruits, and vegetables.   - Patient is advised to stick to a routine mealtimes to eat 3 meals a day and avoid unnecessary snacks (to snack only to correct hypoglycemia).  - I have approached her with the following individualized plan to manage her diabetes and patient agrees:   -She is advised to continue Lantus  60 units SQ nightly and Humalog  20-26 units TID with meals if glucose is above 90 and she is eating (Specific instructions on how  to titrate insulin  dosage based on glucose readings given to patient in writing).  Will try to restart her on Mounjaro  2.5 mg SQ weekly x 1 month then increase to 5 mg SQ weekly.   -she is encouraged to continue monitoring glucose 4 times daily (using her CGM), before meals and before bed, and to call the clinic if she has readings less than 70 or above 300 for 3 tests in a row.  I resent in script for [place] 3 plus sensors.  With her being on MDI, she can certainly benefit from continuous glucose monitoring for safety purposes.  I did give her 2 sample sensors.  - she is warned not to take insulin  without proper monitoring per orders. - Adjustment parameters are given to her for hypo and hyperglycemia in writing.  - Specific targets for  A1c; LDL, HDL, and Triglycerides were discussed with the patient.  2) Blood Pressure /Hypertension:  her blood pressure is controlled to target.   she is advised to continue her current medications as prescribed by PCP.  3) Lipids/Hyperlipidemia:    Review of her recent lipid panel from 05/23/21 showed controlled LDL at 87. She is advised to avoid fried foods and butter.    4)  Weight/Diet:  her Body mass index is 60.35 kg/m.  -  clearly complicating her diabetes care.   she is a candidate for weight loss. I discussed with her the fact that loss of 5 - 10% of her  current body weight will have the most impact on her diabetes management.  Exercise, and detailed carbohydrates information provided  -  detailed on discharge instructions.  5) Chronic Care/Health Maintenance: -she is on ACEI/ARB and is encouraged to initiate and continue to follow up with Ophthalmology, Dentist, Podiatrist at least yearly or according to recommendations, and advised to stay away from smoking. I have recommended yearly flu vaccine and pneumonia vaccine at least every 5 years; moderate intensity exercise for up to 150 minutes weekly; and sleep for at least 7 hours a day.  - she is  advised to maintain close follow up with Lonna Millman, DO for primary care needs, as well as her other providers for optimal and coordinated care.     I spent  48  minutes in the care of the patient today including review of labs from CMP, Lipids, Thyroid  Function, Hematology (current and previous including abstractions from other facilities); face-to-face time discussing  her blood glucose readings/logs, discussing hypoglycemia and hyperglycemia episodes and symptoms, medications doses, her options of short and long term treatment based on the latest standards of care / guidelines;  discussion about incorporating lifestyle medicine;  and documenting the encounter. Risk reduction counseling performed per USPSTF guidelines to reduce obesity and cardiovascular risk factors.     Please refer to Patient Instructions for Blood Glucose Monitoring and Insulin /Medications Dosing Guide  in  media tab for additional information. Please  also refer to  Patient Self Inventory in the Media  tab for reviewed elements of pertinent patient history.  Nicole [name] participated in the discussions, expressed understanding, and voiced agreement with the above plans.  All questions were answered to her satisfaction. she is encouraged to contact clinic should she have any questions or concerns prior to her return visit.     Follow up plan: - Return in about 3 months (around 10/25/2024) for Diabetes F/U with A1c in office, No previsit labs, Bring meter and logs.   Benton Rio, [facility] [address] [place], [place] [address] Phone: [phone] Fax: [phone]  07/27/2024, 10:31 AM     "

## 2024-08-02 NOTE — Progress Notes (Unsigned)
 " Cardiology Office Note:    Date:  08/03/2024   ID:  Nicole [name], DOB Jan 03, 1966, MRN [id number]  PCP:  Lonna Millman, DO   [facility] Providers Cardiologist:  Annabella Scarce, MD     Referring MD: Lonna Millman, DO   Chief Complaint  Patient presents with   New Patient (Initial Visit)    DOE, LE edema    History of Present Illness:    Nicole [name] is a 59 y.o. female with a hx of HTN, DM2, OSA, and obesity. She follows with endocrinology for DM on insulin . She self-referred to cardiology for SOB and LE edema.   She went to urgent care yesterday for sinus issues. While there, she also reported chest pain. She is having a fibromyalgia flare and asked for toradol  shot but this was not given. IDDM at baseline. Provider also reported lower extremity swelling. He mentioned CHF and she is concerned.   Recent weight gain of 40 lbs since Sept. She reports DOE. No orthopnea, but wears a CPAP. LE edema x 1 year.   She is sedentary secondary to significant joint pain from fibromyalgia. She is able to climb flight of stairs at home, which causes DOE but no chest pain.   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 She lives at home with husband and two children ages 91, 17 yo, and a 38 yo in [place] .    She is a never smoker, no alcohol, no THC, no illicit drugs.   Family history: Father died at 30 yo of MI, DM Mother died at 58 yo of MI, DM Brother has asthma, prediabetic, no heart issues   Past Medical History:  Diagnosis Date   Depression    Diabetes mellitus    Fibromyalgia    GERD (gastroesophageal reflux disease)    Hiatal hernia    HTN (hypertension)    Obesity    RLS (restless legs syndrome)    Sleep apnea    Vitamin D  deficiency    Vitamin D  deficiency     Past Surgical History:  Procedure Laterality Date   CATARACT EXTRACTION     CESAREAN SECTION     TUBAL LIGATION      Current Medications: Active Medications[1]   Allergies:    Metformin   Social History   Socioeconomic History   Marital status: Married    Spouse name: Not on file   Number of children: Not on file   Years of education: MBA   Highest education level: Not on file  Occupational History   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  Tobacco Use   Smoking status: Never   Smokeless tobacco: Never  Vaping Use   Vaping status: Never Used  Substance and Sexual Activity   Alcohol use: No   Drug use: No   Sexual activity: Not on file  Other Topics Concern   Not on file  Social History Narrative   Not on file   Social Drivers of Health   Tobacco Use: Low Risk (08/03/2024)   Patient History    Smoking Tobacco Use: Never    Smokeless Tobacco Use: Never    Passive Exposure: Not on file  Financial Resource Strain: Not on file  Food Insecurity: Not on file  Transportation Needs: Not on file  Physical Activity: Not on file  Stress: Not on file  Social Connections: Not on file  Depression (EYV7-0): Not on file  Alcohol Screen:  Not on file  Housing: Not on file  Utilities: Not on file  Health Literacy: Not on file     Family History: The patient's family history includes Arthritis in her mother; Asthma in an other family member; Cancer in an other family member; Diabetes in an other family member; Heart disease in an other family member. There is no history of Rectal cancer, Esophageal cancer, or Stomach cancer.  ROS:   Please see the history of present illness.     All other systems reviewed and are negative.  EKGs/Labs/Other Studies Reviewed:    The following studies were reviewed today:  EKG Interpretation Date/Time:  Wednesday August 03 2024 09:24:53 EST Ventricular Rate:  89 PR Interval:  166 QRS Duration:  86 QT Interval:  380 QTC Calculation: 462 R Axis:   -9  Text Interpretation: Normal sinus rhythm Normal ECG No previous ECGs available Confirmed by Madie Slough ([id number]) on 08/03/2024 9:32:01 AM    Recent  Labs: No results found for requested labs within last 365 days.  Recent Lipid Panel    Component Value Date/Time   CHOL 157 05/23/2021 0940   TRIG 109 05/23/2021 0940   HDL 50 05/23/2021 0940   CHOLHDL 3.1 05/23/2021 0940   LDLCALC 87 05/23/2021 0940     Risk Assessment/Calculations:            Physical Exam:    VS:  BP (!) 148/90   Pulse 89   Ht 4' 8 (1.422 m)   Wt 271 lb 1.3 oz (123 kg)   LMP 04/07/2016   SpO2 98%   BMI 60.77 kg/m     Wt Readings from Last 3 Encounters:  08/03/24 271 lb 1.3 oz (123 kg)  07/27/24 269 lb 3.2 oz (122.1 kg)  04/22/24 273 lb 6.4 oz (124 kg)     GEN:  Well nourished, well developed in no acute distress HEENT: Normal NECK: No JVD; No carotid bruits LYMPHATICS: No lymphadenopathy CARDIAC: RRR, no murmurs, rubs, gallops RESPIRATORY:  Clear to auscultation without rales, wheezing or rhonchi  ABDOMEN: Soft, non-tender, non-distended MUSCULOSKELETAL:  No edema; No deformity  SKIN: Warm and dry NEUROLOGIC:  Alert and oriented x 3 PSYCHIATRIC:  Normal affect   ASSESSMENT:    1. SOB (shortness of breath)   2. Lower extremity edema   3. Essential hypertension, benign   4. Type 2 diabetes mellitus with hyperglycemia, with long-term current use of insulin  (HCC)   5. Morbid obesity (HCC)   6. OSA on CPAP    PLAN:    In order of problems listed above:  Chest pain - infrequent, not correlated with activity (climbing stairs) - will obtain echo first, if LVEF reduced, will likely need left and right heart cath given uncontrolled DM - if echo largely normal, would proceed with CT coronary - strong family history of premature heart disease - will obtain lipid panel and LPA   Lower extremity swelling - on hydrochlorothiazide - will add 40 mg lasix  x 4 days and monitor weight (I do not have recent renal function) - today will obtain BMP and BNP - obtain echo ASAP - she eats a high sodium diet - discussed sodium restriction - may  need to consider changing losartan/hydrochlorothiazide to entresto pending echo results   Hypertension  - continue 12.5 mg hydrochlorothiazide, 10 mg amlodipine, 100 mg losartan - she did not take BP medications today - BP controlled at home 120/98 - keep a BP log   IDDM with hyperglycemia -  A1c 12.2% - on insulin  - recent weight gain   OSA - on CPAP   Obesity - recent weight gain of 40 lbs since Sept (232 lbs -> 271 lbs) - I do not think this is all increase in calories, could be a fluid component   Follow up with Dr. Raford after echo, patient requested.        Medication Adjustments/Labs and Tests Ordered: Current medicines are reviewed at length with the patient today.  Concerns regarding medicines are outlined above.  Orders Placed This Encounter  Procedures   Basic Metabolic Panel (BMET)   Pro b natriuretic peptide (BNP)   Lipid panel   Lipoprotein A (LPA)   EKG 12-Lead   ECHOCARDIOGRAM COMPLETE   Meds ordered this encounter  Medications   furosemide  (LASIX ) 40 MG tablet    Sig: Take 1 tablet by mouth daily for 4 days then hold.    Dispense:  30 tablet    Refill:  0    Patient Instructions  Medication Instructions:  Your physician has recommended you make the following change in your medication:  START Lasix  40 mg taking 1 daily for 4 days then hold  *If you need a refill on your cardiac medications before your next appointment, please call your pharmacy*  Lab Work: TODAY:  BMET, PRO BNP, LIPID, & LPa   If you have labs (blood work) drawn today and your tests are completely normal, you will receive your results only by: MyChart Message (if you have MyChart) OR A paper copy in the mail If you have any lab test that is abnormal or we need to change your treatment, we will call you to review the results.  Testing/Procedures: Your physician has requested that you have an echocardiogram. Echocardiography is a painless test that uses sound waves to  create images of your heart. It provides your doctor with information about the size and shape of your heart and how well your hearts chambers and valves are working. This procedure takes approximately one hour. There are no restrictions for this procedure. Please do NOT wear cologne, perfume, aftershave, or lotions (deodorant is allowed). Please arrive 15 minutes prior to your appointment time.  Please note: We ask at that you not bring children with you during ultrasound (echo/ vascular) testing. Due to room size and safety concerns, children are not allowed in the ultrasound rooms during exams. Our front office staff cannot provide observation of children in our lobby area while testing is being conducted. An adult accompanying a patient to their appointment will only be allowed in the ultrasound room at the discretion of the ultrasound technician under special circumstances. We apologize for any inconvenience.    Follow-Up: At [facility], you and your health needs are our priority.  As part of our continuing mission to provide you with exceptional heart care, our providers are all part of one team.  This team includes your primary Cardiologist (physician) and Advanced Practice Providers or APPs (Physician Assistants and Nurse Practitioners) who all work together to provide you with the care you need, when you need it.  Your next appointment:   1-2 WEEKS AFTER [facility]   Provider:   Annabella Raford, MD    We recommend signing up for the patient portal called MyChart.  Sign up information is provided on this After Visit Summary.  MyChart is used to connect with patients for Virtual Visits (Telemedicine).  Patients are able to view lab/test results, encounter notes, upcoming appointments, etc.  Non-urgent messages can be sent to your provider as well.   To learn more about what you can do with MyChart, go to [email or web address].   Other Instructions  PLEASE READ AND FOLLOW  ATTACHED  SALTY 6      Please monitor your blood pressure and daily weights at home.  Keep a log.  If you are consistently seeing your blood pressure is running higher then 130/90, PLEASE CALL US .  Weigh daily, as Angie has directed you.             Signed, Jon Nat Hails, [place]  08/03/2024 10:19 AM    [facility]     [1]  Current Meds  Medication Sig   amLODipine (NORVASC) 10 MG tablet Take 10 mg by mouth daily.   aspirin EC 81 MG tablet Take 81 mg by mouth daily.   Blood Glucose Monitoring Suppl (ACCU-CHEK GUIDE) w/Device KIT 1 Piece by Does not apply route as directed.   celecoxib  (CELEBREX ) 200 MG capsule Take 1 capsule (200 mg total) by mouth 2 (two) times daily.   Cholecalciferol 125 MCG (5000 UT) TABS Take 5,000 Units by mouth daily.   Continuous Glucose Sensor (FREESTYLE LIBRE 3 PLUS SENSOR) MISC Change sensor every 15 days.   Docusate Calcium (STOOL SOFTENER PO) Take 2 tablets by mouth daily.   DULoxetine (CYMBALTA) 60 MG capsule Take 60 mg by mouth daily.   furosemide  (LASIX ) 40 MG tablet Take 1 tablet by mouth daily for 4 days then hold.   glucose blood (ACCU-CHEK GUIDE) test strip Use as instructed   hydrochlorothiazide (MICROZIDE) 12.5 MG capsule Take 12.5 mg by mouth daily.   insulin  glargine (LANTUS  SOLOSTAR) 100 UNIT/ML Solostar Pen Inject 60 Units into the skin at bedtime.   insulin  lispro (HUMALOG ) 100 UNIT/ML KwikPen Inject 20-26 Units into the skin 3 (three) times daily. INJECT 16-22 UNITS UNDER THE SKIN THREE TIMES DAILY   Insulin  Pen Needle (PEN NEEDLES) 31G X 8 MM MISC Dispense based on patient and insurance preference. Use up to four times daily as directed. (FOR ICD-10 E10.9, E11.9).   linaclotide  (LINZESS ) 145 MCG CAPS capsule Take 1 capsule (145 mcg total) by mouth daily before breakfast. Samples of this drug were given to the patient, quantity 8, Lot Number [id number], [id number] 01/2023   losartan (COZAAR) 100 MG tablet Take 100 mg by mouth daily.    methocarbamol  (ROBAXIN ) 500 MG tablet Take 1 tablet (500 mg total) by mouth in the morning and at bedtime.   polyethylene glycol powder (GLYCOLAX/MIRALAX) 17 GM/SCOOP powder Take 17 g by mouth daily.   pregabalin (LYRICA) 100 MG capsule Take 100 mg by mouth 2 (two) times daily.   RESTASIS MULTIDOSE 0.05 % ophthalmic emulsion SMARTSIG:In Eye(s)   Current Facility-Administered Medications for the 08/03/24 encounter (Office Visit) with Hails Jon Nat, PA  Medication   0.9 %  sodium chloride  infusion   "

## 2024-08-03 ENCOUNTER — Other Ambulatory Visit: Payer: Self-pay | Admitting: *Deleted

## 2024-08-03 ENCOUNTER — Encounter: Payer: Self-pay | Admitting: Physician Assistant

## 2024-08-03 ENCOUNTER — Telehealth: Payer: Self-pay | Admitting: *Deleted

## 2024-08-03 ENCOUNTER — Ambulatory Visit: Attending: Physician Assistant | Admitting: Physician Assistant

## 2024-08-03 VITALS — BP 148/90 | HR 89 | Ht <= 58 in | Wt 271.1 lb

## 2024-08-03 DIAGNOSIS — R0602 Shortness of breath: Secondary | ICD-10-CM

## 2024-08-03 DIAGNOSIS — Z794 Long term (current) use of insulin: Secondary | ICD-10-CM

## 2024-08-03 DIAGNOSIS — R600 Localized edema: Secondary | ICD-10-CM | POA: Diagnosis not present

## 2024-08-03 DIAGNOSIS — I10 Essential (primary) hypertension: Secondary | ICD-10-CM

## 2024-08-03 DIAGNOSIS — G4733 Obstructive sleep apnea (adult) (pediatric): Secondary | ICD-10-CM

## 2024-08-03 DIAGNOSIS — E1165 Type 2 diabetes mellitus with hyperglycemia: Secondary | ICD-10-CM | POA: Diagnosis not present

## 2024-08-03 MED ORDER — FUROSEMIDE 40 MG PO TABS: ORAL_TABLET | ORAL | 0 refills | Status: AC

## 2024-08-03 MED ORDER — TIRZEPATIDE 2.5 MG/0.5ML ~~LOC~~ SOAJ: SUBCUTANEOUS | 4 refills | Status: AC

## 2024-08-03 NOTE — Patient Instructions (Addendum)
 Medication Instructions:  Your physician has recommended you make the following change in your medication:  START Lasix  40 mg taking 1 daily for 4 days then hold  *If you need a refill on your cardiac medications before your next appointment, please call your pharmacy*  Lab Work: TODAY:  BMET, PRO BNP, LIPID, & LPa   If you have labs (blood work) drawn today and your tests are completely normal, you will receive your results only by: MyChart Message (if you have MyChart) OR A paper copy in the mail If you have any lab test that is abnormal or we need to change your treatment, we will call you to review the results.  Testing/Procedures: Your physician has requested that you have an echocardiogram. Echocardiography is a painless test that uses sound waves to create images of your heart. It provides your doctor with information about the size and shape of your heart and how well your hearts chambers and valves are working. This procedure takes approximately one hour. There are no restrictions for this procedure. Please do NOT wear cologne, perfume, aftershave, or lotions (deodorant is allowed). Please arrive 15 minutes prior to your appointment time.  Please note: We ask at that you not bring children with you during ultrasound (echo/ vascular) testing. Due to room size and safety concerns, children are not allowed in the ultrasound rooms during exams. Our front office staff cannot provide observation of children in our lobby area while testing is being conducted. An adult accompanying a patient to their appointment will only be allowed in the ultrasound room at the discretion of the ultrasound technician under special circumstances. We apologize for any inconvenience.    Follow-Up: At [facility], you and your health needs are our priority.  As part of our continuing mission to provide you with exceptional heart care, our providers are all part of one team.  This team includes your  primary Cardiologist (physician) and Advanced Practice Providers or APPs (Physician Assistants and Nurse Practitioners) who all work together to provide you with the care you need, when you need it.  Your next appointment:   1-2 WEEKS AFTER [facility]   Provider:   Annabella Scarce, MD    We recommend signing up for the patient portal called MyChart.  Sign up information is provided on this After Visit Summary.  MyChart is used to connect with patients for Virtual Visits (Telemedicine).  Patients are able to view lab/test results, encounter notes, upcoming appointments, etc.  Non-urgent messages can be sent to your provider as well.   To learn more about what you can do with MyChart, go to [email or web address].   Other Instructions  PLEASE READ AND FOLLOW ATTACHED  SALTY 6      Please monitor your blood pressure and daily weights at home.  Keep a log.  If you are consistently seeing your blood pressure is running higher then 130/90, PLEASE CALL US .  Weigh daily, as Angie has directed you, starting tomorrow.  Weigh and take your 1st dose of Lasix  tomorrow.  Monitor your weight.  If you see it going down then back up by 2-5 lbs in 1 day, let us  know.    DASH Eating Plan  YOUR LIMIT OF SODIUM PER DAY IS 1800 MG.  PLEASE READ LABELS ON ALL FOOD DASH stands for Dietary Approaches to Stop Hypertension. The DASH eating plan is a healthy eating plan that has been shown to: Lower high blood pressure (hypertension). Reduce your risk for type 2  diabetes, heart disease, and stroke. Help with weight loss. What are tips for following this plan? Reading food labels Check food labels for the amount of salt (sodium) per serving. Choose foods with less than 5 percent of the Daily Value (DV) of sodium. In general, foods with less than 300 milligrams (mg) of sodium per serving fit into this eating plan. To find whole grains, look for the word whole as the first word in the ingredient  list. Shopping Buy products labeled as low-sodium or no salt added. Buy fresh foods. Avoid canned foods and pre-made or frozen meals. Cooking Try not to add salt when you cook. Use salt-free seasonings or herbs instead of table salt or sea salt. Check with your health care provider or pharmacist before using salt substitutes. Do not fry foods. Cook foods in healthy ways, such as baking, boiling, grilling, roasting, or broiling. Cook using oils that are good for your heart. These include olive, canola, avocado, soybean, and sunflower oil. Meal planning  Eat a balanced diet. This should include: 4 or more servings of fruits and 4 or more servings of vegetables each day. Try to fill half of your plate with fruits and vegetables. 6-8 servings of whole grains each day. 6 or less servings of lean meat, poultry, or fish each day. 1 oz is 1 serving. A 3 oz (85 g) serving of meat is about the same size as the palm of your hand. One egg is 1 oz (28 g). 2-3 servings of low-fat dairy each day. One serving is 1 cup (237 mL). 1 serving of nuts, seeds, or beans 5 times each week. 2-3 servings of heart-healthy fats. Healthy fats called omega-3 fatty acids are found in foods such as walnuts, flaxseeds, fortified milks, and eggs. These fats are also found in cold-water fish, such as sardines, salmon, and mackerel. Limit how much you eat of: Canned or prepackaged foods. Food that is high in trans fat, such as fried foods. Food that is high in saturated fat, such as fatty meat. Desserts and other sweets, sugary drinks, and other foods with added sugar. Full-fat dairy products. Do not salt foods before eating. Do not eat more than 4 egg yolks a week. Try to eat at least 2 vegetarian meals a week. Eat more home-cooked food and less restaurant, buffet, and fast food. Lifestyle When eating at a restaurant, ask if your food can be made with less salt or no salt. If you drink alcohol: Limit how much you have  to: 0-1 drink a day if you are female. 0-2 drinks a day if you are female. Know how much alcohol is in your drink. In the [place] one drink is one 12 oz bottle of beer (355 mL), one 5 oz glass of wine (148 mL), or one 1 oz glass of hard liquor (44 mL). General information Avoid eating more than 2,300 mg of salt a day. If you have hypertension, you may need to reduce your sodium intake to 1,500 mg a day. Work with your provider to stay at a healthy body weight or lose weight. Ask what the best weight range is for you. On most days of the week, get at least 30 minutes of exercise that causes your heart to beat faster. This may include walking, swimming, or biking. Work with your provider or dietitian to adjust your eating plan to meet your specific calorie needs. What foods should I eat? Fruits All fresh, dried, or frozen fruit. Canned fruits that are in  their natural juice and do not have sugar added to them. Vegetables Fresh or frozen vegetables that are raw, steamed, roasted, or grilled. Low-sodium or reduced-sodium tomato and vegetable juice. Low-sodium or reduced-sodium tomato sauce and tomato paste. Low-sodium or reduced-sodium canned vegetables. Grains Whole-grain or whole-wheat bread. Whole-grain or whole-wheat pasta. Brown rice. Mcneil Madeira. Bulgur. Whole-grain and low-sodium cereals. Pita bread. Low-fat, low-sodium crackers. Whole-wheat flour tortillas. Meats and other proteins Skinless chicken or turkey. Ground chicken or turkey. Pork with fat trimmed off. Fish and seafood. Egg whites. Dried beans, peas, or lentils. Unsalted nuts, nut butters, and seeds. Unsalted canned beans. Lean cuts of beef with fat trimmed off. Low-sodium, lean precooked or cured meat, such as sausages or meat loaves. Dairy Low-fat (1%) or fat-free (skim) milk. Reduced-fat, low-fat, or fat-free cheeses. Nonfat, low-sodium ricotta or cottage cheese. Low-fat or nonfat yogurt. Low-fat, low-sodium cheese. Fats and  oils Soft margarine without trans fats. Vegetable oil. Reduced-fat, low-fat, or light mayonnaise and salad dressings (reduced-sodium). Canola, safflower, olive, avocado, soybean, and sunflower oils. Avocado. Seasonings and condiments Herbs. Spices. Seasoning mixes without salt. Other foods Unsalted popcorn and pretzels. Fat-free sweets. The items listed above may not be all the foods and drinks you can have. Talk to a dietitian to learn more. What foods should I avoid? Fruits Canned fruit in a light or heavy syrup. Fried fruit. Fruit in cream or butter sauce. Vegetables Creamed or fried vegetables. Vegetables in a cheese sauce. Regular canned vegetables that are not marked as low-sodium or reduced-sodium. Regular canned tomato sauce and paste that are not marked as low-sodium or reduced-sodium. Regular tomato and vegetable juices that are not marked as low-sodium or reduced-sodium. Dene. Olives. Grains Baked goods made with fat, such as croissants, muffins, or some breads. Dry pasta or rice meal packs. Meats and other proteins Fatty cuts of meat. Ribs. Fried meat. [place]. Bologna, salami, and other precooked or cured meats, such as sausages or meat loaves, that are not lean and low in sodium. Fat from the back of a pig (fatback). Bratwurst. Salted nuts and seeds. Canned beans with added salt. Canned or smoked fish. Whole eggs or egg yolks. Chicken or turkey with skin. Dairy Whole or 2% milk, cream, and half-and-half. Whole or full-fat cream cheese. Whole-fat or sweetened yogurt. Full-fat cheese. Nondairy creamers. Whipped toppings. Processed cheese and cheese spreads. Fats and oils Butter. Stick margarine. Lard. Shortening. Ghee. Bacon fat. Tropical oils, such as coconut, palm kernel, or palm oil. Seasonings and condiments Onion salt, garlic salt, seasoned salt, table salt, and sea salt. Worcestershire sauce. Tartar sauce. Barbecue sauce. Teriyaki sauce. Soy sauce, including reduced-sodium soy  sauce. Steak sauce. Canned and packaged gravies. Fish sauce. Oyster sauce. Cocktail sauce. Store-bought horseradish. Ketchup. Mustard. Meat flavorings and tenderizers. Bouillon cubes. Hot sauces. Pre-made or packaged marinades. Pre-made or packaged taco seasonings. Relishes. Regular salad dressings. Other foods Salted popcorn and pretzels. The items listed above may not be all the foods and drinks you should avoid. Talk to a dietitian to learn more. Where to find more information National Heart, Lung, and Blood Institute (NHLBI): buffalodrycleaner.gl American Heart Association (AHA): heart.org Academy of Nutrition and Dietetics: eatright.org National Kidney Foundation (NKF): kidney.org This information is not intended to replace advice given to you by your health care provider. Make sure you discuss any questions you have with your health care provider. Document Revised: 07/31/2022 Document Reviewed: 07/31/2022 Elsevier Patient Education  2024 [employer].

## 2024-08-03 NOTE — Telephone Encounter (Signed)
 Patient states that she was told by the [facility] that she would need a PA for the restart of her Mounjaro . Patient advised that this would be sent to our PA team to start the process for this medication.

## 2024-08-04 ENCOUNTER — Ambulatory Visit: Payer: Self-pay | Admitting: Physician Assistant

## 2024-08-04 ENCOUNTER — Ambulatory Visit (HOSPITAL_COMMUNITY)
Admission: RE | Admit: 2024-08-04 | Discharge: 2024-08-04 | Disposition: A | Discharge status: Home / Self Care | Source: Ambulatory Visit | Attending: *Deleted | Admitting: *Deleted

## 2024-08-04 DIAGNOSIS — R600 Localized edema: Secondary | ICD-10-CM | POA: Diagnosis not present

## 2024-08-04 DIAGNOSIS — R0602 Shortness of breath: Secondary | ICD-10-CM | POA: Diagnosis present

## 2024-08-04 LAB — BASIC METABOLIC PANEL WITH GFR
BUN/Creatinine Ratio: 11 (ref 9–23)
BUN: 13 mg/dL (ref 6–24)
CO2: 25 mmol/L (ref 20–29)
Calcium: 9.8 mg/dL (ref 8.7–10.2)
Chloride: 101 mmol/L (ref 96–106)
Creatinine, Ser: 1.18 mg/dL — ABNORMAL HIGH (ref 0.57–1.00)
Glucose: 141 mg/dL — ABNORMAL HIGH (ref 70–99)
Potassium: 4.7 mmol/L (ref 3.5–5.2)
Sodium: 140 mmol/L (ref 134–144)
eGFR: 54 mL/min/1.73 — ABNORMAL LOW

## 2024-08-04 LAB — ECHOCARDIOGRAM COMPLETE
Area-P 1/2: 4.19 cm2
Calc EF: 65.1 %
S' Lateral: 2.9 cm
Single Plane A2C EF: 64.4 %
Single Plane A4C EF: 64.1 %

## 2024-08-04 LAB — PRO B NATRIURETIC PEPTIDE: NT-Pro BNP: <36 pg/mL (ref 0–287)

## 2024-08-08 NOTE — Telephone Encounter (Signed)
 Yes, we can continue to work on BP control. Can you provide us  some home BP readings? You will be seeing Dr. Raford Jan 28 who will continue working with you on BP control.

## 2024-08-09 ENCOUNTER — Other Ambulatory Visit (HOSPITAL_COMMUNITY): Payer: Self-pay

## 2024-08-09 NOTE — Telephone Encounter (Signed)
 Patient is checking the status of her PA.

## 2024-08-10 ENCOUNTER — Other Ambulatory Visit (HOSPITAL_COMMUNITY): Payer: Self-pay

## 2024-08-10 ENCOUNTER — Telehealth: Payer: Self-pay

## 2024-08-10 NOTE — Telephone Encounter (Signed)
 Pharmacy Patient Advocate Encounter   Received notification from Pt Calls Messages that prior authorization for Mounjaro  2.5MG /0.5ML auto-injectors is required/requested.   Insurance verification completed.   The patient is insured through [employer].   Per test claim: PA required; PA submitted to above mentioned insurance via Latent Key/confirmation #/EOC [id number] Status is pending

## 2024-08-23 NOTE — Telephone Encounter (Signed)
 Patient has left a message  stating that she has been told that no PA has been sent. She shared that her PA is to go to [facility] for the GLP1.  Patient will be made aware that one has been completed and the status is pending. Will ask the RX PA Team to take a look at this.

## 2024-08-23 NOTE — Telephone Encounter (Signed)
 PA has been resubmitted. Key: I will call insurance tomorrow to follow up

## 2024-08-24 ENCOUNTER — Other Ambulatory Visit (HOSPITAL_COMMUNITY): Payer: Self-pay

## 2024-08-24 ENCOUNTER — Encounter (HOSPITAL_BASED_OUTPATIENT_CLINIC_OR_DEPARTMENT_OTHER): Payer: Self-pay | Admitting: Cardiovascular Disease

## 2024-08-24 ENCOUNTER — Ambulatory Visit (HOSPITAL_BASED_OUTPATIENT_CLINIC_OR_DEPARTMENT_OTHER): Admitting: Orthopaedic Surgery

## 2024-08-24 ENCOUNTER — Ambulatory Visit (HOSPITAL_BASED_OUTPATIENT_CLINIC_OR_DEPARTMENT_OTHER): Admitting: Cardiovascular Disease

## 2024-08-24 VITALS — BP 126/76 | HR 87 | Ht <= 58 in | Wt 276.7 lb

## 2024-08-24 DIAGNOSIS — M5442 Lumbago with sciatica, left side: Secondary | ICD-10-CM | POA: Diagnosis not present

## 2024-08-24 DIAGNOSIS — G8929 Other chronic pain: Secondary | ICD-10-CM | POA: Diagnosis not present

## 2024-08-24 DIAGNOSIS — I10 Essential (primary) hypertension: Secondary | ICD-10-CM

## 2024-08-24 DIAGNOSIS — M5441 Lumbago with sciatica, right side: Secondary | ICD-10-CM | POA: Diagnosis not present

## 2024-08-24 DIAGNOSIS — Z5181 Encounter for therapeutic drug level monitoring: Secondary | ICD-10-CM

## 2024-08-24 MED ORDER — VALSARTAN 320 MG PO TABS: 320.0000 mg | ORAL_TABLET | Freq: Every day | ORAL | 3 refills | Status: AC

## 2024-08-24 MED ORDER — SPIRONOLACTONE 25 MG PO TABS: 25.0000 mg | ORAL_TABLET | Freq: Every day | ORAL | 3 refills | Status: AC

## 2024-08-24 NOTE — Progress Notes (Signed)
 "   Chief Complaint: Radiating lower back pain     History of Present Illness:   08/24/2024: Presents today for follow-up of her back as well as her bilateral knees.  She has been experiencing significant knee pain over the last several months.  She is currently in a flareup of her fibromyalgia  Nicole [name] is a 59 y.o. female presents today with ongoing lower back pain which she has had for multiple years but has been progressively worse over the course of the last year.  She does work in dentist.  She does have a job predominantly sitting but does experience pain with most activities including sitting and standing.  She has previously had 2 epidural steroid injections which the first which gave her extremely good relief but the second was less effective.  She has participated in physical therapy in the past.    PMH/PSH/Family History/Social History/Meds/Allergies:    Past Medical History:  Diagnosis Date   Depression    Diabetes mellitus    Fibromyalgia    GERD (gastroesophageal reflux disease)    Hiatal hernia    HTN (hypertension)    Obesity    RLS (restless legs syndrome)    Sleep apnea    Vitamin D  deficiency    Vitamin D  deficiency    Past Surgical History:  Procedure Laterality Date   CATARACT EXTRACTION     CESAREAN SECTION     TUBAL LIGATION     Social History   Socioeconomic History   Marital status: Married    Spouse name: Not on file   Number of children: Not on file   Years of education: MBA   Highest education level: Not on file  Occupational History   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  Tobacco Use   Smoking status: Never   Smokeless tobacco: Never  Vaping Use   Vaping status: Never Used  Substance and Sexual Activity   Alcohol use: No   Drug use: No   Sexual activity: Not on file  Other Topics Concern   Not on file  Social History Narrative   Not on file   Social Drivers of Health   Tobacco Use: Low Risk  (08/24/2024)   Patient History    Smoking Tobacco Use: Never    Smokeless Tobacco Use: Never    Passive Exposure: Not on file  Financial Resource Strain: Not on file  Food Insecurity: Not on file  Transportation Needs: Not on file  Physical Activity: Not on file  Stress: Not on file  Social Connections: Not on file  Depression (EYV7-0): Not on file  Alcohol Screen: Not on file  Housing: Not on file  Utilities: Not on file  Health Literacy: Not on file   Family History  Problem Relation Age of Onset   Arthritis Mother    Heart disease Other    Cancer Other    Asthma Other    Diabetes Other    Rectal cancer Neg Hx    Esophageal cancer Neg Hx    Stomach cancer Neg Hx    Allergies  Allergen Reactions   Metformin    Current Outpatient Medications  Medication Sig Dispense Refill   aspirin EC 81 MG tablet Take 81 mg by mouth daily.     Blood Glucose Monitoring Suppl (ACCU-CHEK GUIDE) w/Device KIT 1 Piece by Does not apply route as directed. 1 kit 0   celecoxib  (CELEBREX ) 200 MG capsule Take 1 capsule (200 mg  total) by mouth 2 (two) times daily. 60 capsule 2   Cholecalciferol 125 MCG (5000 UT) TABS Take 5,000 Units by mouth daily.     Continuous Glucose Sensor (FREESTYLE LIBRE 3 PLUS SENSOR) MISC Change sensor every 15 days. 6 each 3   Docusate Calcium (STOOL SOFTENER PO) Take 2 tablets by mouth daily.     DULoxetine (CYMBALTA) 60 MG capsule Take 60 mg by mouth daily.     furosemide  (LASIX ) 40 MG tablet Take 1 tablet by mouth daily for 4 days then hold. (Patient not taking: Reported on 08/24/2024) 30 tablet 0   glucose blood (ACCU-CHEK GUIDE) test strip Use as instructed 50 each 12   hydrochlorothiazide (MICROZIDE) 12.5 MG capsule Take 12.5 mg by mouth daily.     insulin  glargine (LANTUS  SOLOSTAR) 100 UNIT/ML Solostar Pen Inject 60 Units into the skin at bedtime. 54 mL 1   insulin  lispro (HUMALOG ) 100 UNIT/ML KwikPen Inject 20-26 Units into the skin 3 (three) times daily. INJECT  16-22 UNITS UNDER THE SKIN THREE TIMES DAILY 75 mL 3   Insulin  Pen Needle (PEN NEEDLES) 31G X 8 MM MISC Dispense based on patient and insurance preference. Use up to four times daily as directed. (FOR ICD-10 E10.9, E11.9). 300 each 3   linaclotide  (LINZESS ) 145 MCG CAPS capsule Take 1 capsule (145 mcg total) by mouth daily before breakfast. Samples of this drug were given to the patient, quantity 8, Lot Number [id number], [id number] 01/2023 8 capsule 0   methocarbamol  (ROBAXIN ) 500 MG tablet Take 1 tablet (500 mg total) by mouth in the morning and at bedtime. 30 tablet 4   polyethylene glycol powder (GLYCOLAX/MIRALAX) 17 GM/SCOOP powder Take 17 g by mouth daily. (Patient taking differently: Take 17 g by mouth as needed.)     pregabalin (LYRICA) 100 MG capsule Take 100 mg by mouth 2 (two) times daily.     RESTASIS MULTIDOSE 0.05 % ophthalmic emulsion SMARTSIG:In Eye(s)     spironolactone  (ALDACTONE ) 25 MG tablet Take 1 tablet (25 mg total) by mouth daily. 90 tablet 3   tirzepatide  (MOUNJARO ) 2.5 MG/0.5ML Pen Patient is to inject 0.25 weekly subcutaneous for 1 month, if she tolerates well she may increase the dose to 0.5 mg weekly per provider's instruction. (Patient not taking: Reported on 08/24/2024) 0.5 mL 4   valsartan  (DIOVAN ) 320 MG tablet Take 1 tablet (320 mg total) by mouth daily. 90 tablet 3   Current Facility-Administered Medications  Medication Dose Route Frequency Provider Last Rate Last Admin   0.9 %  sodium chloride  infusion  500 mL Intravenous Once Abran Norleen SAILOR, MD       No results found.  Review of Systems:   A ROS was performed including pertinent positives and negatives as documented in the HPI.  Physical Exam :   Constitutional: NAD and appears stated age Neurological: Alert and oriented Psych: Appropriate affect and cooperative Last menstrual period 04/07/2016.   Comprehensive Musculoskeletal Exam:    She has pain that does radiate to left lower extremity and forward flexion  of the trunk while sitting.  There is positive straight leg raise on the left.  Remainder of distal neurosensory exams intact with full strength in knee extension   Imaging:   Xray (4 views lumbar spine): There is a small amount of scoliosis about the lumbar spine with some  I personally reviewed and interpreted the radiographs.   Assessment and Plan:   59 y.o. female with evidence of lumbar back pain in  the setting of essentially normal MRI without evidence of nerve impingement.  We did discuss that there is evidence of dehydration of the L4-L5 as well as L5-S1 disc levels consistent with degenerative disc disease.  Her left knee does show evidence of mild osteoarthritis although I do believe this is consistent with somewhat of a fibromyalgia flareup.  Given this we will plan to send a referral to our pain management team as she would like to discuss treatment options medically for fibromyalgia and with regard to her weight loss I would like to plan to refer her to our medical weight loss team.  - Return to clinic as needed   I personally saw and evaluated the patient, and participated in the management and treatment plan.  Elspeth Parker, MD Attending Physician, Orthopedic Surgery  This document was dictated using Dragon voice recognition software. A reasonable attempt at proof reading has been made to minimize errors. "

## 2024-08-24 NOTE — Patient Instructions (Signed)
 Medication Instructions:  STOP LOSARTAN   STOP AMLODIPINE   START SPIRONOLACTONE  25 MG DAILY   START VALSARTAN  320 MG DAILY   *If you need a refill on your cardiac medications before your next appointment, please call your pharmacy*  Lab Work: BMET IN 1 WEEK   If you have labs (blood work) drawn today and your tests are completely normal, you will receive your results only by: MyChart Message (if you have MyChart) OR A paper copy in the mail If you have any lab test that is abnormal or we need to change your treatment, we will call you to review the results.  Testing/Procedures: NONE  Follow-Up: At [facility], you and your health needs are our priority.  As part of our continuing mission to provide you with exceptional heart care, our providers are all part of one team.  This team includes your primary Cardiologist (physician) and Advanced Practice Providers or APPs (Physician Assistants and Nurse Practitioners) who all work together to provide you with the care you need, when you need it.  Your next appointment:   2 month(s)  Provider:   Rosaline Bane, NP, Reche Finder, NP, or Allean Mink, PharmD   6 MONTHS WITH DR [facility]   We recommend signing up for the patient portal called MyChart.  Sign up information is provided on this After Visit Summary.  MyChart is used to connect with patients for Virtual Visits (Telemedicine).  Patients are able to view lab/test results, encounter notes, upcoming appointments, etc.  Non-urgent messages can be sent to your provider as well.   To learn more about what you can do with MyChart, go to [email or web address].   Other Instructions

## 2024-08-24 NOTE — Progress Notes (Signed)
 " Cardiology Office Note:  .   Date:  08/24/2024  ID:  Nicole [name], DOB 03/31/1966, MRN [id number] PCP: Lonna Millman, DO  [facility] Providers Cardiologist:  Annabella Scarce, MD    History of Present Illness: .   Nicole [name] is a 59 y.o. female with hypertension, uncontrolled diabetes, OSA, fibromyalgia, and obesity here for follow-up.  She was seen by Mercy Hails, PA 07/2024 with shortness of breath and edema.  She noted a 20 pound weight gain in 4 months.  She also reported occasional episodes of chest pain.  She was taking hydrochlorothiazide and was given Lasix  for 4 days.  Echocardiogram revealed LVEF 60-65% with grade 2 diastolic dysfunction.  Right atrial pressure was 3 mmHg.  NT proBNP was less than 36.  Discussed the use of AI scribe software for clinical note transcription with the patient, who gave verbal consent to proceed.  History of Present Illness Nicole [name] has significant swelling in her legs and ankles, described as feeling like 'pig feet' and causing pain when walking. This swelling began after starting amlodipine in March or April of the previous year. 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  She has a history of hypertension for approximately 15-16 years, which began during her pregnancy. Her blood pressure has been difficult to control, with readings often in the 170s to 180s, and it has reached over 200 in the past. She has been on losartan and hydrochlorothiazide for a long time, and more recently, amlodipine was added to her regimen. However, she feels that her blood pressure is not well-controlled despite these medications.  She experiences fatigue and exhaustion, which she attributes to her weight and the weather, as she also has fibromyalgia and arthritis. She notes that she feels very tired and that her eyes appear tired to others. She has gained weight, likely due to  decreased mobility from the swelling and pain.  She uses a CPAP machine for sleep apnea. She has a family history of cardiovascular disease, with her father passing away at 74 and her mother at 46, both from cardiac-related issues.  In terms of diet, she is trying to reduce salt intake, although she finds herself craving salty foods and soda when stressed, particularly during tax season, which is a stressful time for her.  ROS:  As per HPI  Studies Reviewed: .       Echo 08/04/24:  1. Left ventricular ejection fraction, by estimation, is 60 to 65%. The  left ventricle has normal function. The left ventricle has no regional  wall motion abnormalities. Left ventricular diastolic parameters are  consistent with Grade II diastolic  dysfunction (pseudonormalization). Elevated left atrial pressure. The E/e'  is 22.   2. Right ventricular systolic function is normal. The right ventricular  size is normal. Tricuspid regurgitation signal is inadequate for assessing  PA pressure.   3. The mitral valve is normal in structure. No evidence of mitral valve  regurgitation. No evidence of mitral stenosis.   4. The aortic valve was not well visualized. Aortic valve regurgitation  is not visualized. No aortic stenosis is present.   5. The inferior vena cava is normal in size with greater than 50%  respiratory variability, suggesting right atrial pressure of 3 mmHg.    Risk Assessment/Calculations:             Physical Exam:   VS:  BP 126/76   Pulse  87   Ht 4' 8 (1.422 m)   Wt 276 lb 11.2 oz (125.5 kg)   LMP 04/07/2016   SpO2 99%   BMI 62.03 kg/m  , BMI Body mass index is 62.03 kg/m. GENERAL:  Well appearing HEENT: Pupils equal round and reactive, fundi not visualized, oral mucosa unremarkable NECK:  No jugular venous distention, waveform within normal limits, carotid upstroke brisk and symmetric, no bruits, no thyromegaly LUNGS:  Clear to auscultation bilaterally HEART:  RRR.  PMI not  displaced or sustained,S1 and S2 within normal limits, no S3, no S4, no clicks, no rubs, no murmurs ABD:  Flat, positive bowel sounds normal in frequency in pitch, no bruits, no rebound, no guarding, no midline pulsatile mass, no hepatomegaly, no splenomegaly EXT:  2 plus pulses throughout, non-pitting edema, no cyanosis no clubbing SKIN:  No rashes no nodules NEURO:  Cranial nerves II through XII grossly intact, motor grossly intact throughout PSYCH:  Cognitively intact, oriented to person place and time   ASSESSMENT AND PLAN: .    Assessment & Plan # Primary hypertension  # HFpEF:  Poorly controlled hypertension with diastolic dysfunction on echo, exacerbated by amlodipine-induced edema.  Blood pressure is well-controlled but I think that the amlodipine is causing most of her swelling.  BNP was within normal limits but this may be falsely reassuring in the setting of morbid obesity.  Right atrial pressure was 3 mmHg on echo.  Blood pressure goal less than 130/80. - Discontinued amlodipine. - Initiated spironolactone  25 mg daily for fluid management. - Switched losartan to valsartan  320 mg for improved control. - Continue hydrochlorothiazide 12.5 mg daily. - Monitor blood pressure regularly, bring readings to next appointment. - Check BMP in 1 week - Encouraged low-salt diet and increased water intake.  # Morbid obesity Obesity contributing to decreased mobility and fatigue, exacerbated by fluid retention and high salt intake. - Encouraged weight loss through increased physical activity as tolerated. - Discussed potential exercise program with daughter's assistance. - Address fluid retention to improve mobility and facilitate weight loss. - Continue Mounjaro           Dispo: f/u in 2 months  Signed, Annabella Scarce, MD   "

## 2024-08-24 NOTE — Telephone Encounter (Signed)
 Patient was called and updated.

## 2024-08-24 NOTE — Addendum Note (Signed)
 Addended by: WOLFGANG CONLEY HERO on: 08/24/2024 12:09 PM   Modules accepted: Orders

## 2024-08-26 ENCOUNTER — Other Ambulatory Visit (HOSPITAL_COMMUNITY): Payer: Self-pay

## 2024-08-26 NOTE — Telephone Encounter (Signed)
 Called [employer] at [phone]. They confirmed that they have received our request and it is processing as of 1/28. He said it could take up to 10 days to receive a determination and that they would send a determination via fax.

## 2024-08-30 NOTE — Telephone Encounter (Signed)
 Pharmacy Patient Advocate Encounter  Received notification from [employer] that Prior Authorization for Mounjaro  has been APPROVED from 08/29/2024 to 08/29/2025

## 2024-08-31 ENCOUNTER — Encounter (INDEPENDENT_AMBULATORY_CARE_PROVIDER_SITE_OTHER): Payer: Self-pay

## 2024-08-31 NOTE — Telephone Encounter (Signed)
 Patient was called and made aware.

## 2024-09-30 ENCOUNTER — Ambulatory Visit: Admitting: Rheumatology

## 2024-11-02 ENCOUNTER — Ambulatory Visit: Admitting: Nurse Practitioner

## 2024-11-02 ENCOUNTER — Ambulatory Visit: Admitting: Rheumatology
